# Patient Record
Sex: Male | Born: 1975 | Hispanic: No | Marital: Married | Smoking: Never smoker
Health system: Southern US, Community
[De-identification: ages and names within clinical notes are randomized; demographics above are authoritative.]

## PROBLEM LIST (undated history)

## (undated) DIAGNOSIS — I10 Essential (primary) hypertension: Secondary | ICD-10-CM

## (undated) DIAGNOSIS — L309 Dermatitis, unspecified: Secondary | ICD-10-CM

## (undated) DIAGNOSIS — E785 Hyperlipidemia, unspecified: Secondary | ICD-10-CM

## (undated) HISTORY — DX: Hyperlipidemia, unspecified: E78.5

## (undated) HISTORY — DX: Dermatitis, unspecified: L30.9

## (undated) HISTORY — PX: VASECTOMY: SHX75

## (undated) HISTORY — DX: Essential (primary) hypertension: I10

---

## 2010-10-29 ENCOUNTER — Ambulatory Visit
Admission: RE | Admit: 2010-10-29 | Discharge: 2010-10-29 | Payer: Self-pay | Source: Home / Self Care | Attending: Urology | Admitting: Urology

## 2010-11-02 LAB — POCT I-STAT 4, (NA,K, GLUC, HGB,HCT)
Glucose, Bld: 102 mg/dL — ABNORMAL HIGH (ref 70–99)
HCT: 46 % (ref 39.0–52.0)
Hemoglobin: 15.6 g/dL (ref 13.0–17.0)
Potassium: 4.3 mEq/L (ref 3.5–5.1)
Sodium: 140 mEq/L (ref 135–145)

## 2010-11-05 NOTE — Op Note (Addendum)
  Chris Shah, Chris Shah           ACCOUNT NO.:  [id number]  MEDICAL RECORD NO.:  [id number]          PATIENT TYPE:  AMB  LOCATION:  [facility]                         FACILITY:  [facility]  PHYSICIAN:  Galen Malkowski C. Vernie Ammons, M.D.  DATE OF BIRTH:  [birth year]  DATE OF PROCEDURE:  10/29/2010 DATE OF DISCHARGE:                              OPERATIVE REPORT   PREOPERATIVE DIAGNOSIS:  Redundant foreskin.  POSTOPERATIVE DIAGNOSIS:  Redundant foreskin.  PROCEDURE:  Circumcision.  SURGEON:  Ausencio Vaden C. Vernie Ammons, MD  ANESTHESIA:  General with local supplement.  SPECIMENS:  None.  BLOOD LOSS:  Minimal.  COMPLICATIONS:  None.  INDICATIONS:  The patient is a 35 year old male with a significantly redundant foreskin.  He has elected to proceed with circumcision and we had previously discussed the risks, complications and alternatives.  He understands and has elected to proceed.  DESCRIPTION OF OPERATION:  After informed consent, the patient was brought to the major OR, placed on the table and administered general anesthesia.  The genitalia was sterilely prepped and draped and then an official time-out was performed.  Half percent plain Marcaine was then used to perform a dorsal penile block in standard fashion.  I then marked the penis with a surgical marker where the shaft skin was noted to lie over the corona.  I then retracted the foreskin and made a circumcising incision circumferentially approximately 6 mm from the corona.  I then replaced the foreskin in its normal anatomic position and made a second circumcising incision circumferentially.  I then excised the redundant foreskin and cauterized any bleeding points.  The frenulum was reconstructed with interrupted 3-0 chromic suture and I then placed a 3-0 chromic U stitch at the 6 o'clock position and a second 3-0 chromic at the 12 o'clock position.  The skin edges were then reapproximated using running 3-0 chromic suture.  I applied  Neosporin, 4x4's and a suturing dressing.  The patient was awakened and taken to recovery room in stable and satisfactory condition.  He tolerated procedure well and there no intraoperative complications.  He will be given a prescription for 30 Tylox and follow up in my office in approximately 1 week.  Written discharge instructions were given as well as at the time of discharge.     Avo Schlachter C. Vernie Ammons, M.D.     MCO/MEDQ  D:  10/29/2010  T:  10/29/2010  Job:  [id number]  Electronically Signed by Ihor Gully M.D. on 11/05/2010 04:29:04 AM

## 2016-09-23 ENCOUNTER — Other Ambulatory Visit (HOSPITAL_COMMUNITY): Payer: Self-pay | Admitting: Internal Medicine

## 2016-09-23 ENCOUNTER — Ambulatory Visit (HOSPITAL_COMMUNITY)
Admission: RE | Admit: 2016-09-23 | Discharge: 2016-09-23 | Disposition: A | Discharge status: Home / Self Care | Payer: 59 | Source: Ambulatory Visit | Attending: Internal Medicine | Admitting: Internal Medicine

## 2016-09-23 DIAGNOSIS — R319 Hematuria, unspecified: Secondary | ICD-10-CM | POA: Insufficient documentation

## 2016-09-23 DIAGNOSIS — M549 Dorsalgia, unspecified: Secondary | ICD-10-CM | POA: Diagnosis not present

## 2017-02-12 IMAGING — CT CT RENAL STONE PROTOCOL
2 of 4 series · 17 of 46 positions shown, 19 images · non-contrast
Comparison: None.

CLINICAL DATA: Right flank pain for 3 weeks with hematuria

EXAM:
CT ABDOMEN AND PELVIS WITHOUT CONTRAST
TECHNIQUE: Multidetector CT imaging of the abdomen and pelvis was performed
following the standard protocol without IV contrast.

[Series 2: axial st · axial · 0.75mm/px · z∈[-476,-60]mm · 14 of 93 slices shown, 16 images]
[im 5/93  soft-tissue]
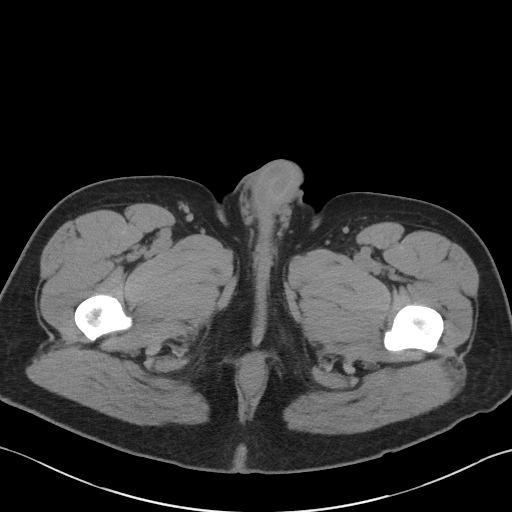
[im 5/93  bone]
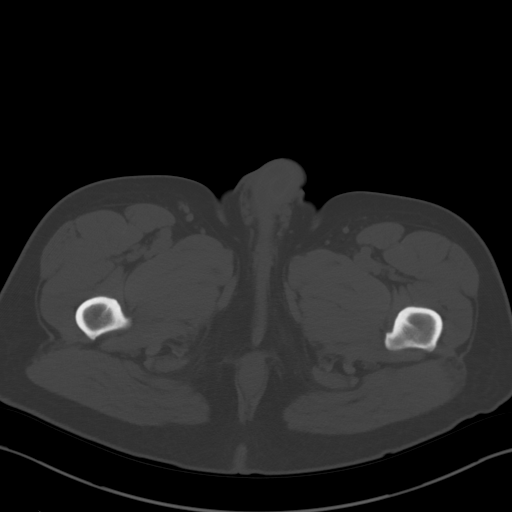
[im 14/93  soft-tissue]
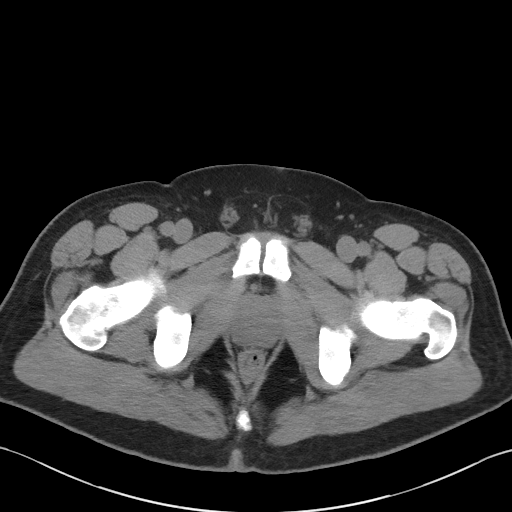
[im 18/93  soft-tissue]
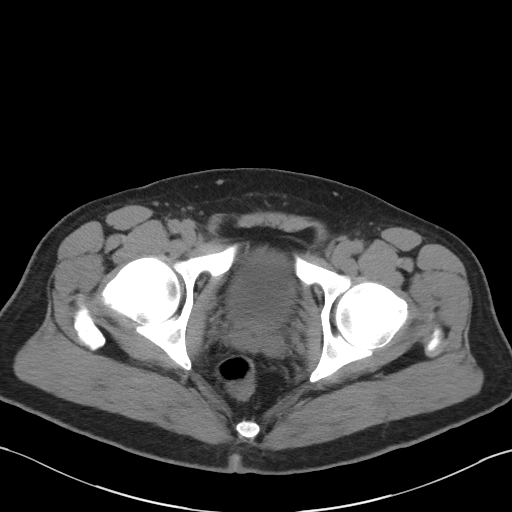
[im 27/93  soft-tissue]
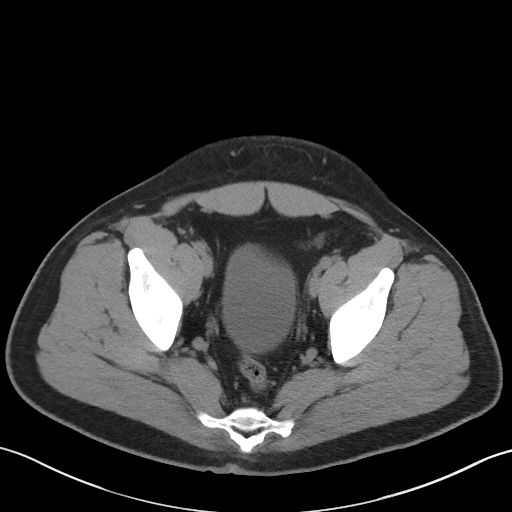
[im 31/93  soft-tissue]
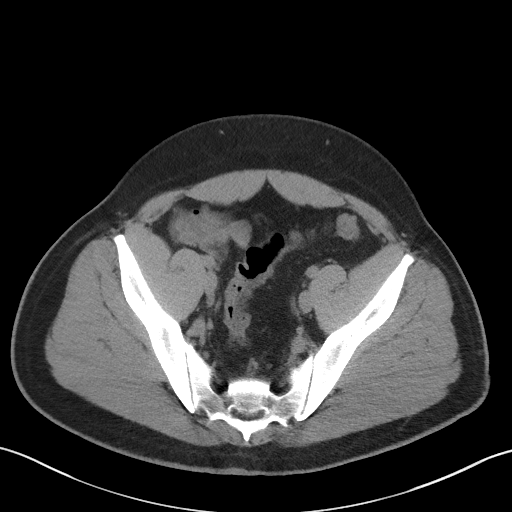
[im 36/93  soft-tissue]
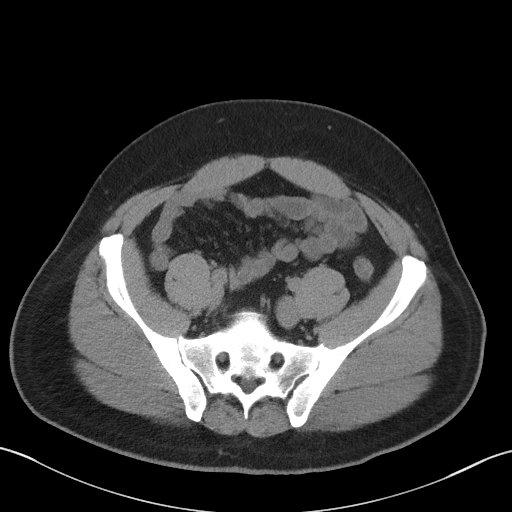
[im 44/93  soft-tissue]
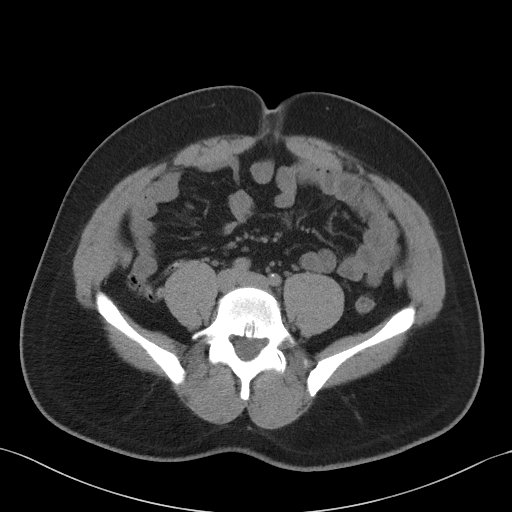
[im 49/93  soft-tissue]
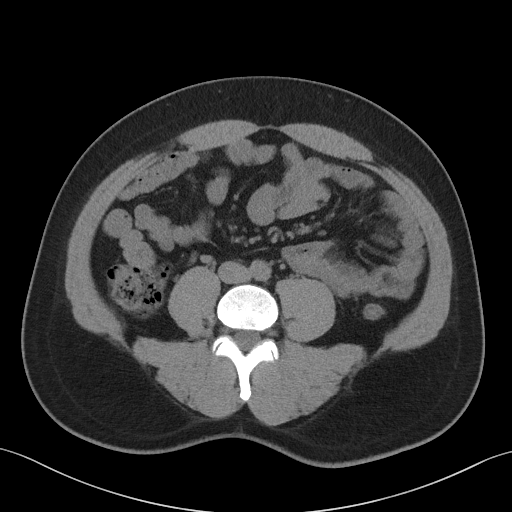
[im 57/93  soft-tissue]
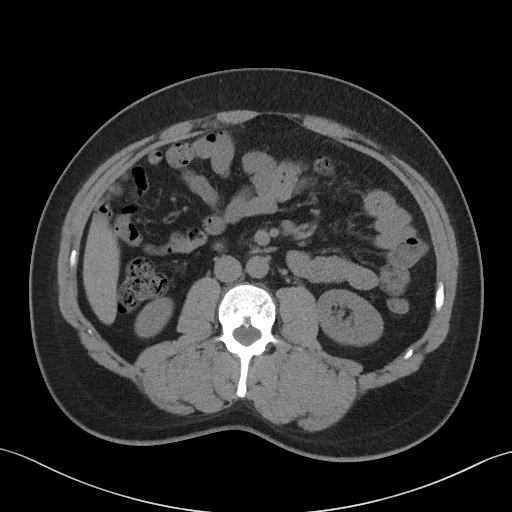
[im 57/93  bone]
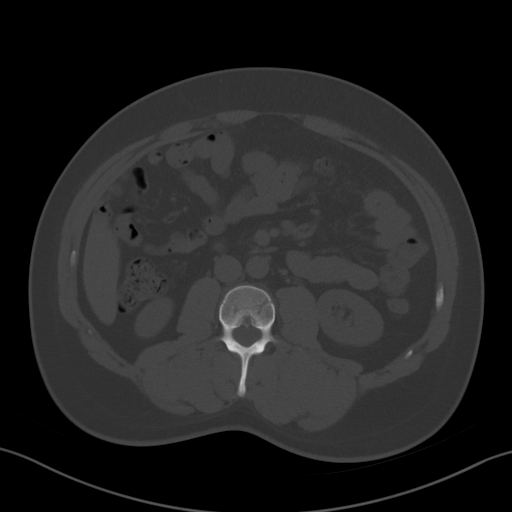
[im 62/93  soft-tissue]
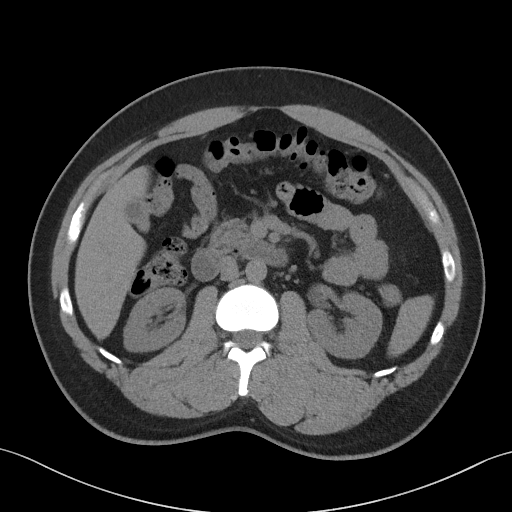
[im 71/93  soft-tissue]
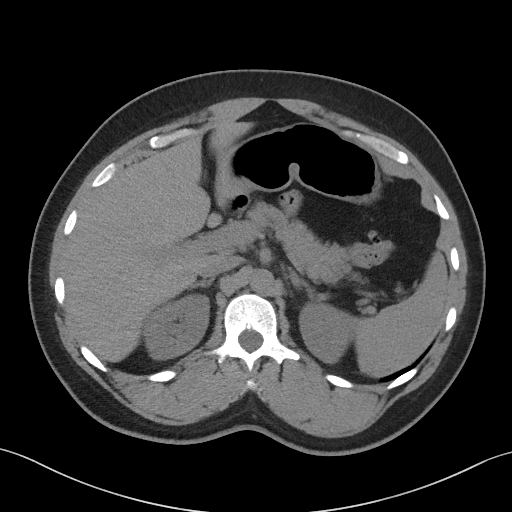
[im 75/93  soft-tissue]
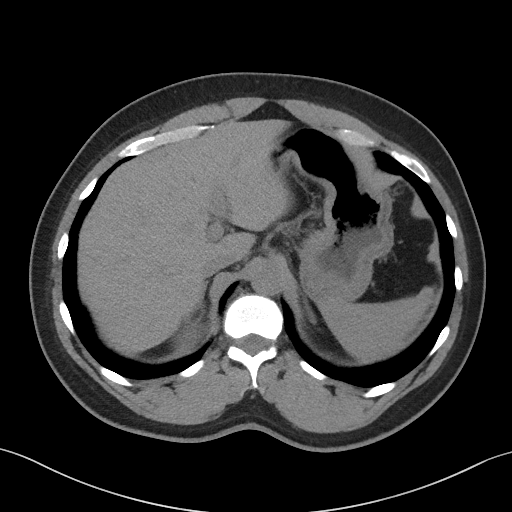
[im 79/93  soft-tissue]
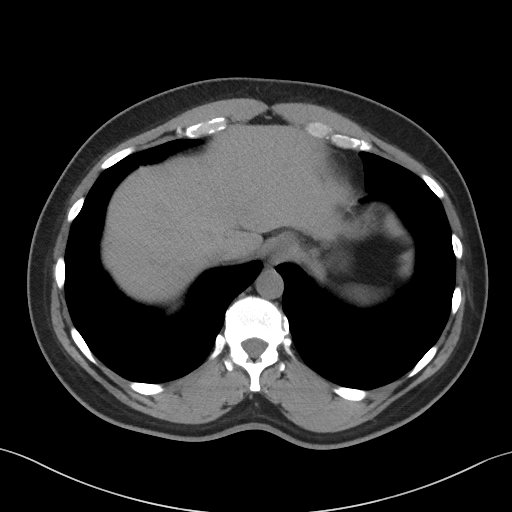
[im 88/93  soft-tissue]
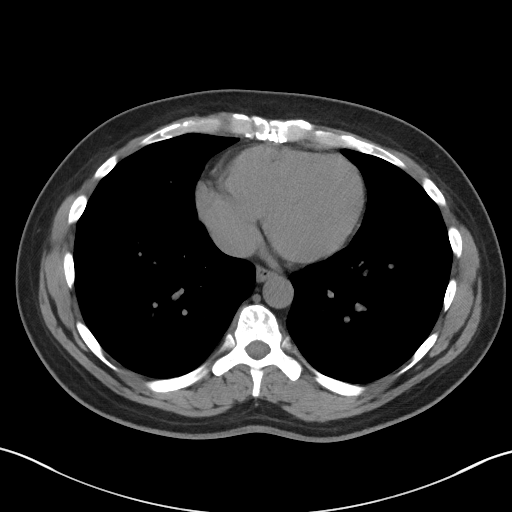

[Series 3: coronal st · coronal · 0.80mm/px · 3 of 101 slices shown]
[im 34/101  soft-tissue]
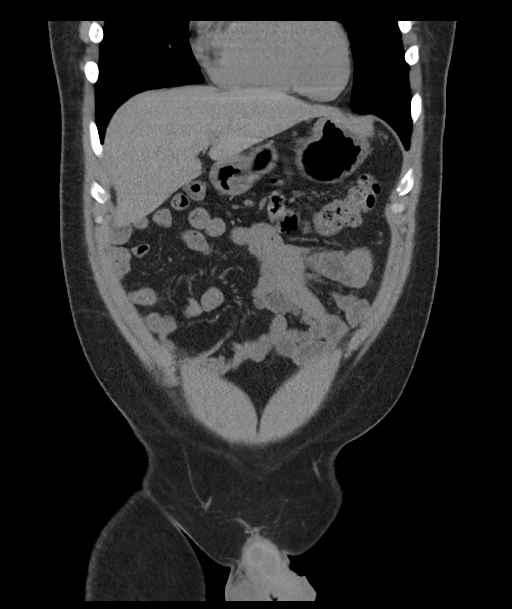
[im 45/101  soft-tissue]
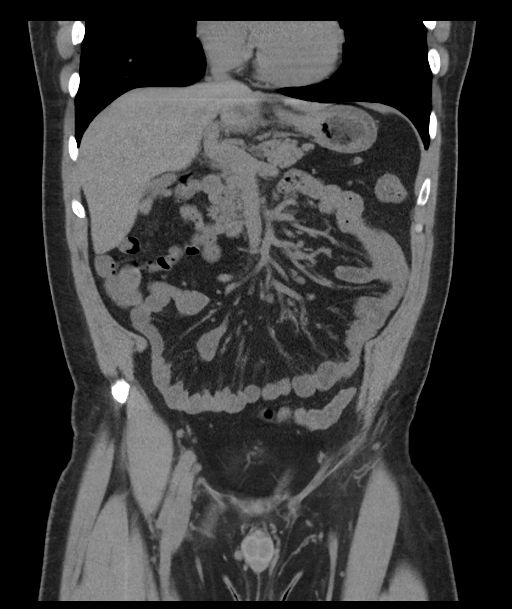
[im 56/101  soft-tissue]
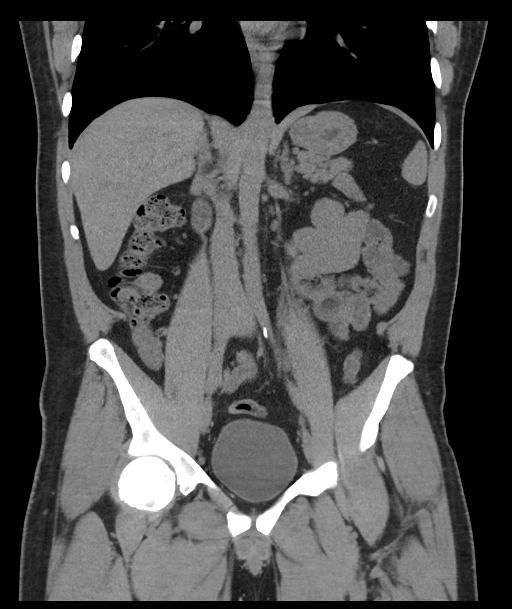

[17 of 46 positions shown; findings below may reference images not displayed]

FINDINGS: Lower chest: No acute abnormality.

Hepatobiliary: No focal liver abnormality is seen. No gallstones,
gallbladder wall thickening, or biliary dilatation.

Pancreas: Unremarkable. No pancreatic ductal dilatation or
surrounding inflammatory changes.

Spleen: Normal in size without focal abnormality.

Adrenals/Urinary Tract: Adrenal glands are unremarkable. Kidneys are
normal, without renal calculi, focal lesion, or hydronephrosis.
Bladder is unremarkable.

Stomach/Bowel: Stomach is within normal limits. Appendix appears
normal. No evidence of bowel wall thickening, distention, or
inflammatory changes.

Vascular/Lymphatic: Aortic atherosclerosis. No enlarged abdominal or
pelvic lymph nodes.

Reproductive: Prostate is unremarkable.

Other: No abdominal wall hernia or abnormality. No abdominopelvic
ascites.

Musculoskeletal: No acute or significant osseous findings.
IMPRESSION: No acute abnormality noted.

## 2017-11-23 ENCOUNTER — Encounter: Payer: Self-pay | Admitting: Family Medicine

## 2018-02-13 ENCOUNTER — Encounter: Payer: Self-pay | Admitting: Family Medicine

## 2018-02-13 ENCOUNTER — Other Ambulatory Visit: Payer: Self-pay

## 2018-02-13 ENCOUNTER — Ambulatory Visit (INDEPENDENT_AMBULATORY_CARE_PROVIDER_SITE_OTHER): Payer: BLUE CROSS/BLUE SHIELD | Admitting: Family Medicine

## 2018-02-13 VITALS — BP 142/88 | HR 82 | Temp 97.7°F | Resp 14 | Ht 71.26 in | Wt 216.0 lb

## 2018-02-13 DIAGNOSIS — I10 Essential (primary) hypertension: Secondary | ICD-10-CM

## 2018-02-13 DIAGNOSIS — M25552 Pain in left hip: Secondary | ICD-10-CM | POA: Diagnosis not present

## 2018-02-13 DIAGNOSIS — E782 Mixed hyperlipidemia: Secondary | ICD-10-CM

## 2018-02-13 DIAGNOSIS — E663 Overweight: Secondary | ICD-10-CM | POA: Diagnosis not present

## 2018-02-13 DIAGNOSIS — Z114 Encounter for screening for human immunodeficiency virus [HIV]: Secondary | ICD-10-CM | POA: Diagnosis not present

## 2018-02-13 DIAGNOSIS — E785 Hyperlipidemia, unspecified: Secondary | ICD-10-CM | POA: Insufficient documentation

## 2018-02-13 DIAGNOSIS — Z0000 Encounter for general adult medical examination without abnormal findings: Secondary | ICD-10-CM | POA: Diagnosis not present

## 2018-02-13 DIAGNOSIS — E669 Obesity, unspecified: Secondary | ICD-10-CM | POA: Insufficient documentation

## 2018-02-13 DIAGNOSIS — E66811 Obesity, class 1: Secondary | ICD-10-CM | POA: Insufficient documentation

## 2018-02-13 MED ORDER — AMLODIPINE-OLMESARTAN 10-40 MG PO TABS: 1.0000 | ORAL_TABLET | Freq: Every day | ORAL | 2 refills | Status: DC

## 2018-02-13 NOTE — Patient Instructions (Signed)
Get xray at [facility] Release of records- Dr. Catalina Pizza  F/U pending results

## 2018-02-13 NOTE — Progress Notes (Signed)
   Subjective:    Patient ID: Chris Shah, male    DOB: 10-13-1975, 42 y.o.   MRN: [id number]  Patient presents for New Patient CPE (is fasting)    Last PCP- Dr. Catalina Pizza - last visit in December   No specialist     DOT - drives locally , previously in sales    HTN- was on Azor , diagnosed 15 years ago   Hyperlipidemia - working on diet    Eats acidic foods/fruits- rash around face mostly berries/hot sauce    3 girls 18, 20,25     Follows with dentist- [employer]- thinks he had tetanus, does not get flu shot      Left Hip pain, feels like it gets a catch at times, no radiating pain, hip pain for 5-6 months. No OTC meds taken.        Review Of Systems:  GEN- denies fatigue, fever, weight loss,weakness, recent illness HEENT- denies eye drainage, change in vision, nasal discharge, CVS- denies chest pain, palpitations RESP- denies SOB, cough, wheeze ABD- denies N/V, change in stools, abd pain GU- denies dysuria, hematuria, dribbling, incontinence MSK- + joint pain, muscle aches, injury Neuro- denies headache, dizziness, syncope, seizure activity       Objective:    BP (!) 142/88   Pulse 82   Temp 97.7 F (36.5 C) (Oral)   Resp 14   Ht 5' 11.26" (1.81 m)   Wt 216 lb (98 kg)   SpO2 97%   BMI 29.91 kg/m  GEN- NAD, alert and oriented x3 HEENT- PERRL, EOMI, non injected sclera, pink conjunctiva, MMM, oropharynx clear Neck- Supple, no thyromegaly CVS- RRR, no murmur RESP-CTAB ABD-NABS,soft,NT,ND MSK- good ROM bilat hip, neck hip rock, pain with IR left hip, pain in hhip with SLR, no radiating symptoms, Spine NT, FROM Spine  Psych- normal affect and mood  EXT- No edema Pulses- Radial, DP- 2+        Assessment & Plan:      Problem List Items Addressed This Visit      Unprioritized   Hyperlipidemia   Relevant Medications   amLODipine-olmesartan (AZOR) 10-40 MG tablet   Other Relevant Orders   Lipid panel (Completed)   Overweight (BMI 25.0-29.9)    Discussed healthy eating, activity      Left hip pain    Obtain xrays       Relevant Orders   DG HIP UNILAT WITH PELVIS 2-3 VIEWS LEFT   Hypertension    Restart BP meds Fasting labs      Relevant Medications   amLODipine-olmesartan (AZOR) 10-40 MG tablet    Other Visit Diagnoses    Routine general medical examination at a health care facility    -  Primary   CPE done, fasting labs, HIV screen   Relevant Orders   CBC with Differential/Platelet (Completed)   Comprehensive metabolic panel (Completed)   Lipid panel (Completed)   Encounter for screening for HIV       Relevant Orders   HIV antibody (Completed)      Note: This dictation was prepared with Dragon dictation along with smaller phrase technology. Any transcriptional errors that result from this process are unintentional.

## 2018-02-14 LAB — LIPID PANEL
CHOLESTEROL: 237 mg/dL — AB (ref <200)
HDL: 50 mg/dL (ref >40)
LDL Cholesterol (Calc): 166 mg/dL (calc) — ABNORMAL HIGH
NON-HDL CHOLESTEROL (CALC): 187 mg/dL — AB (ref <130)
Total CHOL/HDL Ratio: 4.7 (calc) (ref <5.0)
Triglycerides: 99 mg/dL (ref <150)

## 2018-02-14 LAB — CBC WITH DIFFERENTIAL/PLATELET
BASOS PCT: 0.4 %
Basophils Absolute: 28 cells/uL (ref 0–200)
Eosinophils Absolute: 173 cells/uL (ref 15–500)
Eosinophils Relative: 2.5 %
HEMATOCRIT: 42.9 % (ref 38.5–50.0)
HEMOGLOBIN: 14.4 g/dL (ref 13.2–17.1)
Lymphs Abs: 2429 cells/uL (ref 850–3900)
MCH: 26.4 pg — AB (ref 27.0–33.0)
MCHC: 33.6 g/dL (ref 32.0–36.0)
MCV: 78.7 fL — AB (ref 80.0–100.0)
MPV: 9.3 fL (ref 7.5–12.5)
Monocytes Relative: 12.1 %
NEUTROS ABS: 3436 {cells}/uL (ref 1500–7800)
Neutrophils Relative %: 49.8 %
Platelets: 282 10*3/uL (ref 140–400)
RBC: 5.45 10*6/uL (ref 4.20–5.80)
RDW: 13.8 % (ref 11.0–15.0)
Total Lymphocyte: 35.2 %
WBC: 6.9 10*3/uL (ref 3.8–10.8)
WBCMIX: 835 {cells}/uL (ref 200–950)

## 2018-02-14 LAB — COMPREHENSIVE METABOLIC PANEL
AG RATIO: 1.5 (calc) (ref 1.0–2.5)
ALKALINE PHOSPHATASE (APISO): 59 U/L (ref 40–115)
ALT: 38 U/L (ref 9–46)
AST: 38 U/L (ref 10–40)
Albumin: 4.2 g/dL (ref 3.6–5.1)
BUN: 16 mg/dL (ref 7–25)
CO2: 28 mmol/L (ref 20–32)
Calcium: 9.7 mg/dL (ref 8.6–10.3)
Chloride: 103 mmol/L (ref 98–110)
Creat: 1 mg/dL (ref 0.60–1.35)
Globulin: 2.8 g/dL (calc) (ref 1.9–3.7)
Glucose, Bld: 96 mg/dL (ref 65–99)
Potassium: 4.2 mmol/L (ref 3.5–5.3)
Sodium: 138 mmol/L (ref 135–146)
Total Bilirubin: 1.3 mg/dL — ABNORMAL HIGH (ref 0.2–1.2)
Total Protein: 7 g/dL (ref 6.1–8.1)

## 2018-02-14 LAB — HIV ANTIBODY (ROUTINE TESTING W REFLEX): HIV 1&2 Ab, 4th Generation: NONREACTIVE

## 2018-02-14 NOTE — Assessment & Plan Note (Signed)
Obtain x-rays 

## 2018-02-14 NOTE — Assessment & Plan Note (Signed)
Restart BP meds Fasting labs

## 2018-02-14 NOTE — Assessment & Plan Note (Signed)
Discussed healthy eating, activity

## 2018-03-01 ENCOUNTER — Encounter: Payer: Self-pay | Admitting: Physician Assistant

## 2018-03-01 ENCOUNTER — Encounter: Payer: Self-pay | Admitting: Family Medicine

## 2018-03-01 ENCOUNTER — Ambulatory Visit (INDEPENDENT_AMBULATORY_CARE_PROVIDER_SITE_OTHER): Payer: BLUE CROSS/BLUE SHIELD | Admitting: Physician Assistant

## 2018-03-01 VITALS — BP 142/102 | HR 79 | Temp 97.6°F | Resp 16 | Ht 71.26 in | Wt 212.8 lb

## 2018-03-01 DIAGNOSIS — M5432 Sciatica, left side: Secondary | ICD-10-CM

## 2018-03-01 MED ORDER — METHYLPREDNISOLONE ACETATE 80 MG/ML IJ SUSP
80.0000 mg | Freq: Once | INTRAMUSCULAR | Status: AC
  Administered 2018-03-01: 80 mg via INTRAMUSCULAR

## 2018-03-01 MED ORDER — PREDNISONE 20 MG PO TABS: ORAL_TABLET | ORAL | 0 refills | Status: DC

## 2018-03-01 MED ORDER — CYCLOBENZAPRINE HCL 10 MG PO TABS: 10.0000 mg | ORAL_TABLET | Freq: Three times a day (TID) | ORAL | 0 refills | Status: DC | PRN

## 2018-03-01 NOTE — Progress Notes (Signed)
Patient ID: Chris Shah MRN: [id number], DOB: 17-Jun-1976, 42 y.o. Date of Encounter: 03/01/2018, 11:27 AM    Chief Complaint:  Chief Complaint  Patient presents with  . left hip pain    jumped out of chair      HPI: 42 y.o. year old male presents with above.   States that this past Sunday is when this occurred.  Says that they were sitting in their driveway and his granddaughter was pushing a stroller.   He jumped up out of the chair to go prevent the stroller from hitting into the car.  When he did that, it caused significant pain in his left sciatic notch region.  Since then, he has continued to have pain in that left sciatic notch region that is going down his left buttock and left thigh.  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  Says that that area already was feeling a little irritated secondary to the repetitive motion of that clutch.  However since he did this movement this past Sunday it has really been painful and feels tight.  Has not been able to go to work this week.  Usually works Monday through Friday.  Last worked last Friday.  Has been out all this week so far.  States that -- other than this area feeling a little irritated recently secondary to repetitively pressing the clutch at work truck, otherwise has never had problems with this area of his body in the past.   Has no known prior history of these kinds of symptoms--- has had no known trauma or injury to the area.     Home Meds:   Outpatient Medications Prior to Visit  Medication Sig Dispense Refill  . amLODipine-olmesartan (AZOR) 10-40 MG tablet Take 1 tablet by mouth daily. 90 tablet 2   No facility-administered medications prior to visit.     Allergies: No Known Allergies    Review of Systems: See HPI for pertinent ROS. All other ROS negative.    Physical Exam: Blood pressure (!) 142/102, pulse 79, temperature 97.6 F (36.4 C), temperature source Oral, resp. rate 16, height 5'  11.26" (1.81 m), weight 96.5 kg (212 lb 12.8 oz), SpO2 99 %., Body mass index is 29.46 kg/m. General: WNWD AAM.  Appears in no acute distress. Neck: Supple. No thyromegaly. No lymphadenopathy. Lungs: Clear bilaterally to auscultation without wheezes, rales, or rhonchi. Breathing is unlabored. Heart: Regular rhythm. No murmurs, rubs, or gallops. Msk:  Strength and tone normal for age. He has severe tenderness with palpation of left sciatic notch.   No significant tenderness with palpation of left low back.   When I enter Into the room he is sitting in a chair but is sitting in an awkward position trying to relieve pressure from the sciatic notch region.  When he gets up from the chair to do the exam he very slowly gets up out of the chair and has to move with very slow movements.  Straight leg raise positive.  Abduction of the hip is also positive and causes increased pain.  Patellar reflexes are intact and equal bilaterally. Extremities/Skin: Warm and dry.  Neuro: Alert and oriented X 3. Moves all extremities spontaneously. Gait is normal. CNII-XII grossly in tact. Psych:  Responds to questions appropriately with a normal affect.     ASSESSMENT AND PLAN:  42 y.o. year old male with  1. Left sided sciatica Depo-Medrol 80 mg IM given here in the office. He  is to start the oral prednisone taper tomorrow and take in the mornings.  I have reviewed the dosing and taper with him as well as possible side effects. I have also discussed with him that the Flexeril may cause drowsiness.  If it does not cause drowsiness then can take up to 3 times daily but if causes drowsiness then may need to limit to bedtime only.   Definitely do not use prior to driving or operating machinery. Note given to cover for missing work all of this week. (oow 02/26/18 -- 03/02/2018) Follow-up if symptoms do not resolve over the next several days. Also discussed that he can apply heat to the area using a heating pad and also  discussed gently stretching the area once symptoms are improved to prevent reoccurrence. - predniSONE (DELTASONE) 20 MG tablet; Take 3 daily for 2 days, then 2 daily for 2 days, then 1 daily for 2 days.  Dispense: 12 tablet; Refill: 0 - cyclobenzaprine (FLEXERIL) 10 MG tablet; Take 1 tablet (10 mg total) by mouth 3 (three) times daily as needed for muscle spasms.  Dispense: 30 tablet; Refill: 0   Signed, [address] [place], [place], [facility] 03/01/2018 11:27 AM

## 2018-03-01 NOTE — Progress Notes (Signed)
Patient was in office and received depo medrol 80 mg in left ventrogluteal. Patient tolerated well

## 2018-03-28 ENCOUNTER — Other Ambulatory Visit (HOSPITAL_COMMUNITY)
Admission: RE | Admit: 2018-03-28 | Discharge: 2018-03-28 | Disposition: A | Discharge status: Home / Self Care | Payer: BLUE CROSS/BLUE SHIELD | Source: Ambulatory Visit | Attending: Gastroenterology | Admitting: Gastroenterology

## 2018-03-28 ENCOUNTER — Telehealth: Payer: Self-pay

## 2018-03-28 ENCOUNTER — Ambulatory Visit (HOSPITAL_COMMUNITY)
Admission: RE | Admit: 2018-03-28 | Discharge: 2018-03-28 | Disposition: A | Discharge status: Home / Self Care | Payer: BLUE CROSS/BLUE SHIELD | Source: Ambulatory Visit | Attending: Gastroenterology | Admitting: Gastroenterology

## 2018-03-28 ENCOUNTER — Telehealth: Payer: Self-pay | Admitting: General Practice

## 2018-03-28 ENCOUNTER — Ambulatory Visit (HOSPITAL_COMMUNITY): Payer: BLUE CROSS/BLUE SHIELD

## 2018-03-28 ENCOUNTER — Encounter (HOSPITAL_COMMUNITY): Payer: Self-pay

## 2018-03-28 DIAGNOSIS — R1032 Left lower quadrant pain: Secondary | ICD-10-CM | POA: Insufficient documentation

## 2018-03-28 LAB — CBC WITH DIFFERENTIAL/PLATELET
BASOS ABS: 0 10*3/uL (ref 0.0–0.1)
Basophils Relative: 0 %
Eosinophils Absolute: 0.1 10*3/uL (ref 0.0–0.7)
Eosinophils Relative: 2 %
HCT: 44.5 % (ref 39.0–52.0)
HEMOGLOBIN: 14.6 g/dL (ref 13.0–17.0)
LYMPHS ABS: 3.1 10*3/uL (ref 0.7–4.0)
LYMPHS PCT: 40 %
MCH: 27.3 pg (ref 26.0–34.0)
MCHC: 32.8 g/dL (ref 30.0–36.0)
MCV: 83.3 fL (ref 78.0–100.0)
Monocytes Absolute: 0.6 10*3/uL (ref 0.1–1.0)
Monocytes Relative: 7 %
NEUTROS PCT: 51 %
Neutro Abs: 4.1 10*3/uL (ref 1.7–7.7)
Platelets: 257 10*3/uL (ref 150–400)
RBC: 5.34 MIL/uL (ref 4.22–5.81)
RDW: 13.4 % (ref 11.5–15.5)
WBC: 7.9 10*3/uL (ref 4.0–10.5)

## 2018-03-28 LAB — URINALYSIS, COMPLETE (UACMP) WITH MICROSCOPIC
BILIRUBIN URINE: NEGATIVE
Bacteria, UA: NONE SEEN
Glucose, UA: NEGATIVE mg/dL
Hgb urine dipstick: NEGATIVE
Ketones, ur: NEGATIVE mg/dL
LEUKOCYTES UA: NEGATIVE
Nitrite: NEGATIVE
PROTEIN: NEGATIVE mg/dL
SPECIFIC GRAVITY, URINE: 1.039 — AB (ref 1.005–1.030)
pH: 7 (ref 5.0–8.0)

## 2018-03-28 MED ORDER — IOPAMIDOL (ISOVUE-300) INJECTION 61%
100.0000 mL | Freq: Once | INTRAVENOUS | Status: AC | PRN
  Administered 2018-03-28: 100 mL via INTRAVENOUS

## 2018-03-28 NOTE — Telephone Encounter (Signed)
T/C from [place],  The report is negative and per Dr. Darrick PennaFields OK for pt to go.

## 2018-03-28 NOTE — Telephone Encounter (Signed)
Patient will be at the lab at 12:15pm and then will go to Radiology for Stat ct scan to start drinking contrast at 12:30 for a 2:30 scan.

## 2018-03-28 NOTE — Telephone Encounter (Signed)
PT HAS ACUTE SEVERE LOWER ABDOMINAL PAIN. STARTED YESTERDAY. OPTIONS ARE GO TO ED OR HAVE STAT LABS/CT.

## 2018-03-28 NOTE — Telephone Encounter (Signed)
PT is aware.

## 2018-03-28 NOTE — Telephone Encounter (Signed)
Labs faxed [facility] to [facility]

## 2018-03-28 NOTE — Telephone Encounter (Signed)
Patient has not had anything to eat or drink since 8:30 am today.

## 2018-03-28 NOTE — Telephone Encounter (Signed)
Patient called in with lower abd pain.  He was moving something heavy yesterday and started having some sharp lower abd pain.  Patient is agreeable to imaging.  Routing to Dr. Darrick PennaFields to order imaging.

## 2018-03-28 NOTE — Telephone Encounter (Addendum)
PLEASE CALL PT. HIS CT DOES NOT SHOW ANY ACUTE PATHOLOGY AND HIS URINE IS NEGATIVE. HIS PAIN MAY BE DUE TO AN ABDOMINAL WALL STRAIN.  TAKE IBUPROFEN 600 MG Q6H FOR 3 DAYS THEN 1 PO TID FOR 7 DAYS. TAKE WITH FOOD OR MILK.  ICE PACK THREE TIMES A DAY HELP WITH PAIN. DO NOT APPLY ICE PACKS DIRECTLY TO YOUR SKIN.  PLEASE  CALL IN 7 DAYS IF SYMPTOMS ARE NOT IMPROVED.

## 2018-03-28 NOTE — Telephone Encounter (Signed)
I called [employer] and spoke with Jola BabinskiMarilyn I. And she stated that precertificaion is not required Ref# [id number]

## 2018-03-29 LAB — URINE CULTURE: Culture: NO GROWTH

## 2018-05-22 ENCOUNTER — Ambulatory Visit: Payer: Self-pay | Admitting: Family Medicine

## 2018-06-15 ENCOUNTER — Ambulatory Visit: Payer: Self-pay | Admitting: Family Medicine

## 2018-08-17 IMAGING — CT CT ABD-PELV W/ CM
2 of 4 series · 17 of 46 positions shown, 19 images · IV contrast (Isovue)
Comparison: 09/23/2016

CLINICAL DATA: Left lower quadrant pain for 2 days.

EXAM:
CT ABDOMEN AND PELVIS WITH CONTRAST
TECHNIQUE: Multidetector CT imaging of the abdomen and pelvis was performed
using the standard protocol following bolus administration of
intravenous contrast.
CONTRAST:  100mL [id number] IOPAMIDOL (TF480I-W55) INJECTION 61%

[Series 2: axial st · axial · 0.85mm/px · z∈[-401,+19]mm · 14 of 92 slices shown, 16 images]
[im 4/92  soft-tissue]
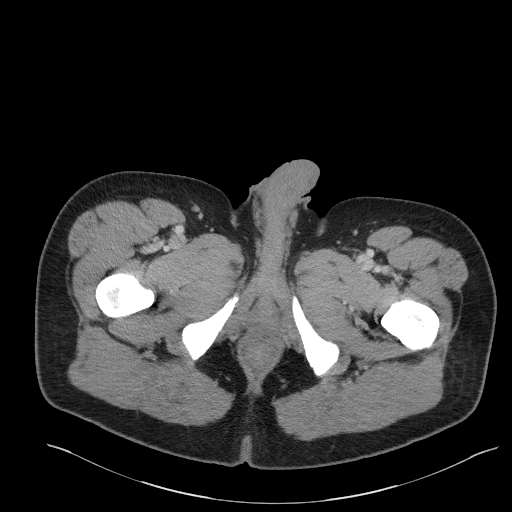
[im 4/92  bone]
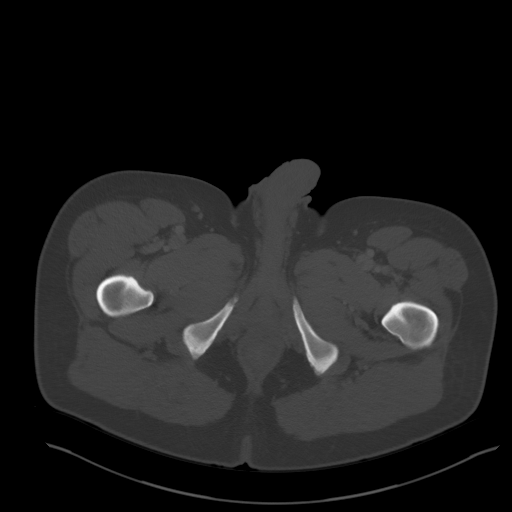
[im 11/92  soft-tissue]
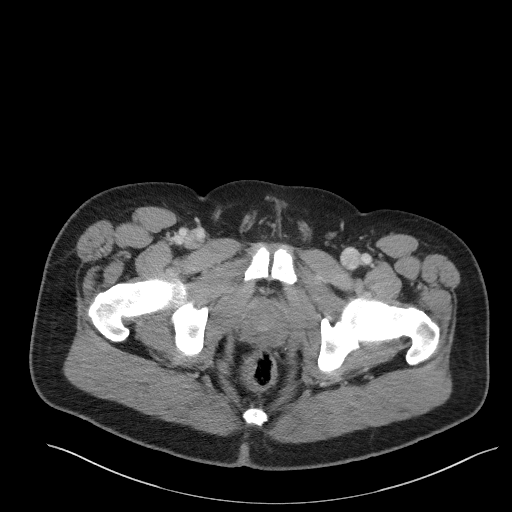
[im 19/92  soft-tissue]
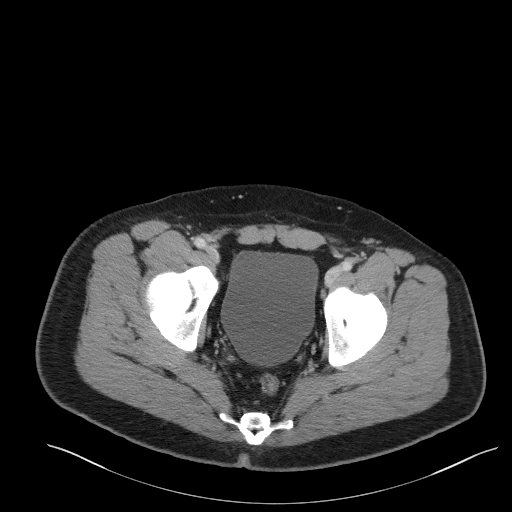
[im 26/92  soft-tissue]
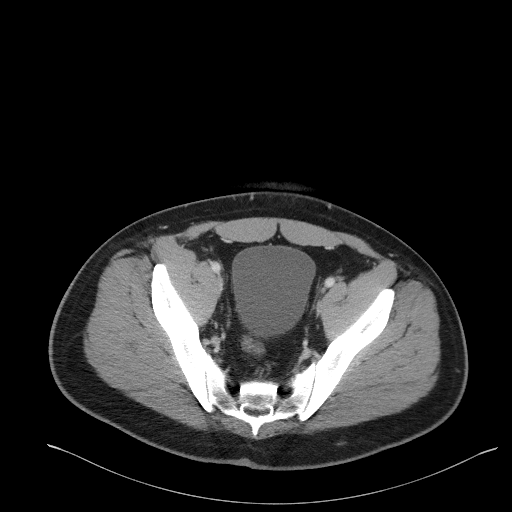
[im 30/92  soft-tissue]
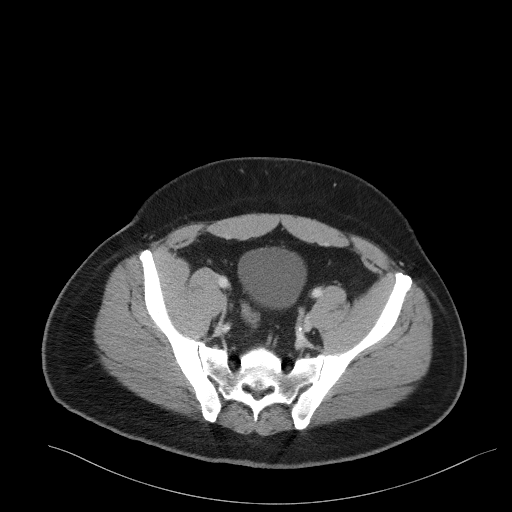
[im 37/92  soft-tissue]
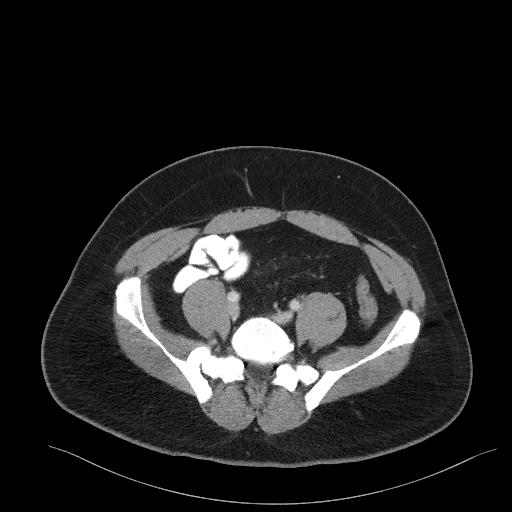
[im 44/92  soft-tissue]
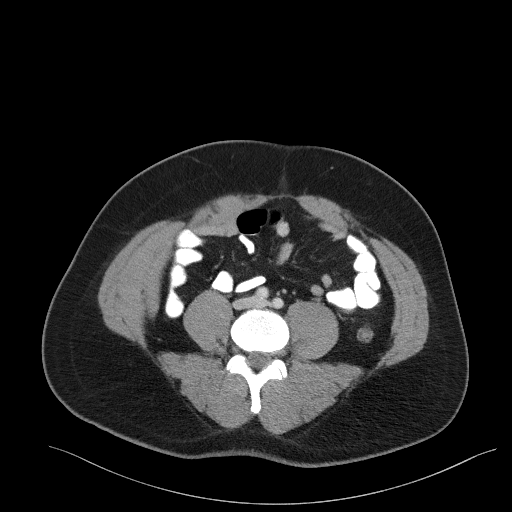
[im 48/92  soft-tissue]
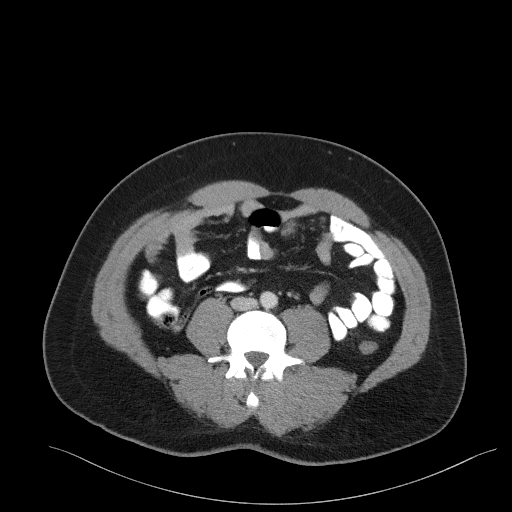
[im 55/92  soft-tissue]
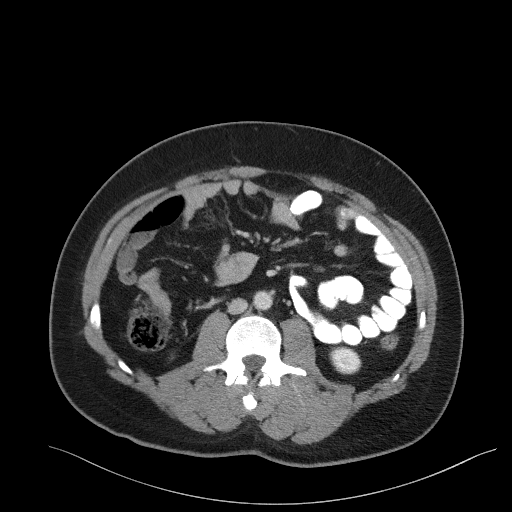
[im 55/92  bone]
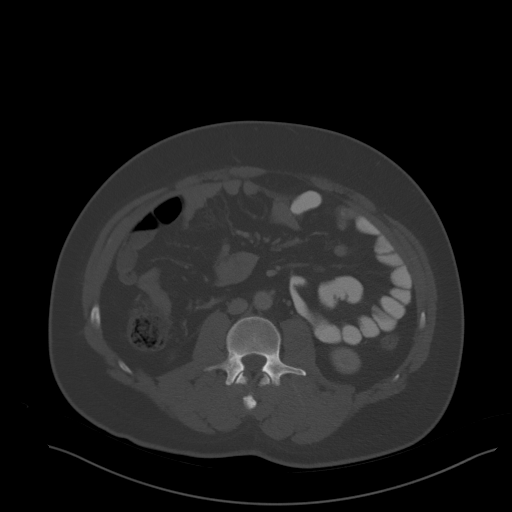
[im 62/92  soft-tissue]
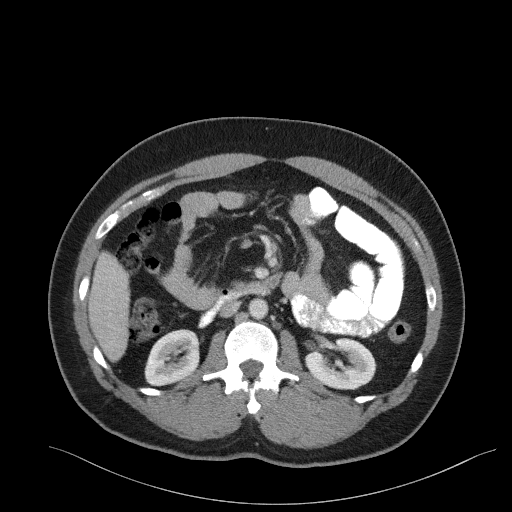
[im 70/92  soft-tissue]
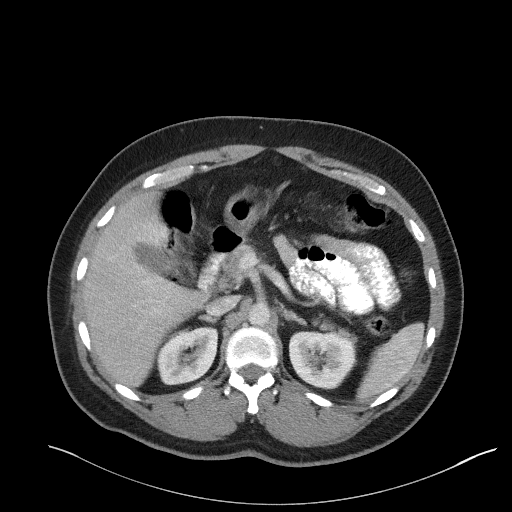
[im 73/92  soft-tissue]
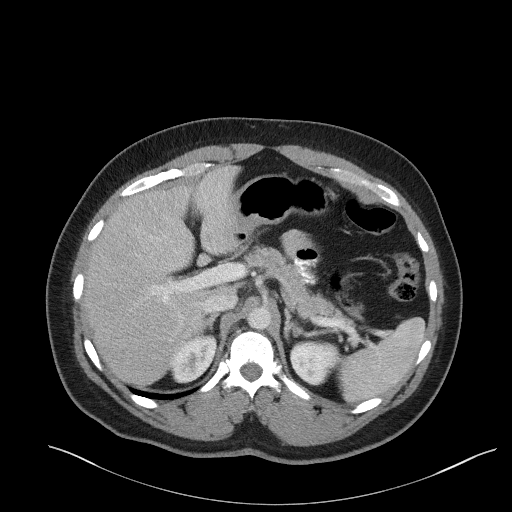
[im 81/92  soft-tissue]
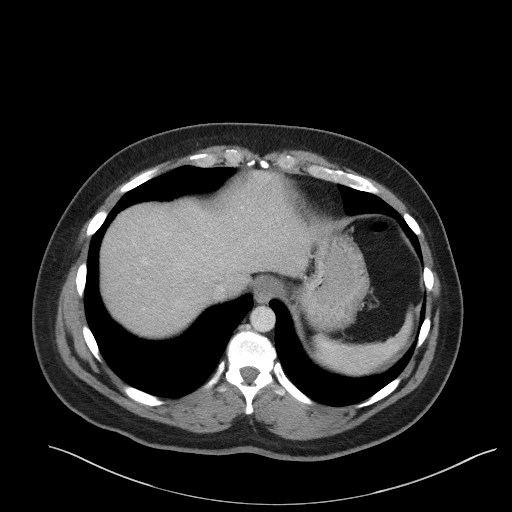
[im 88/92  soft-tissue]
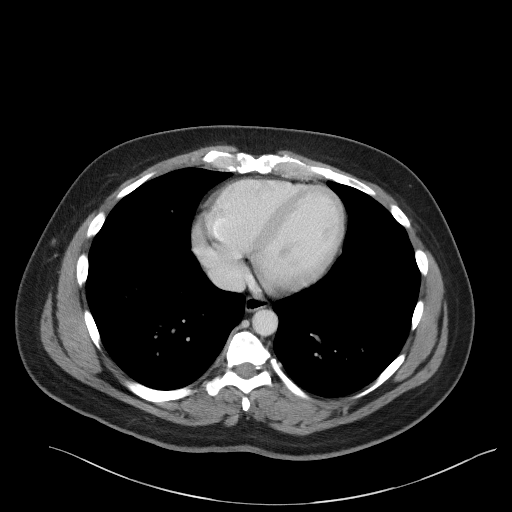

[Series 5: coronal st · coronal · 0.72mm/px · 3 of 101 slices shown]
[im 34/101  soft-tissue]
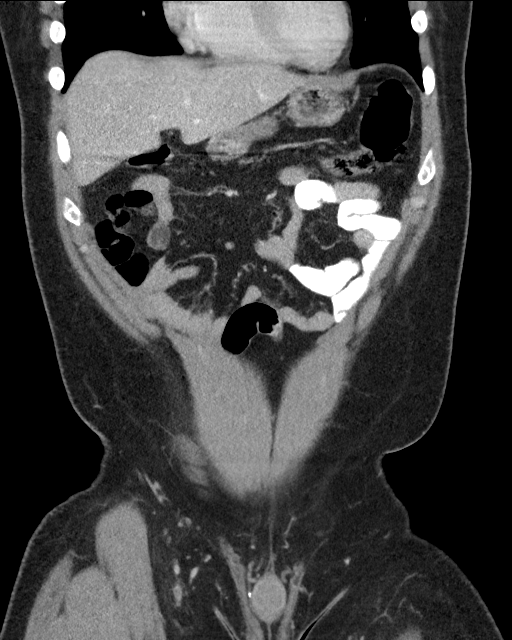
[im 45/101  soft-tissue]
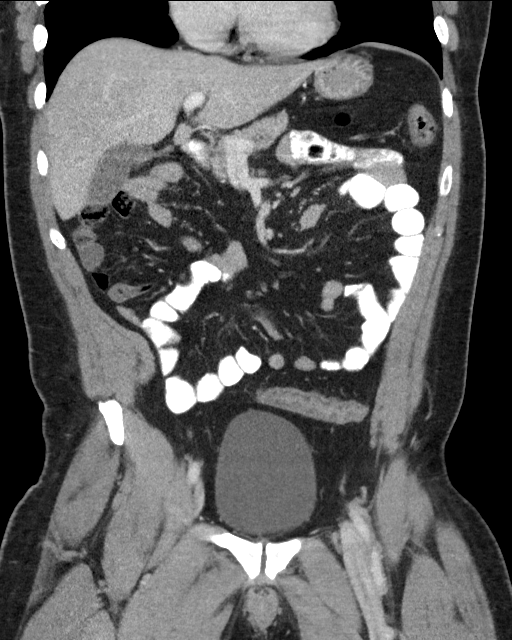
[im 56/101  soft-tissue]
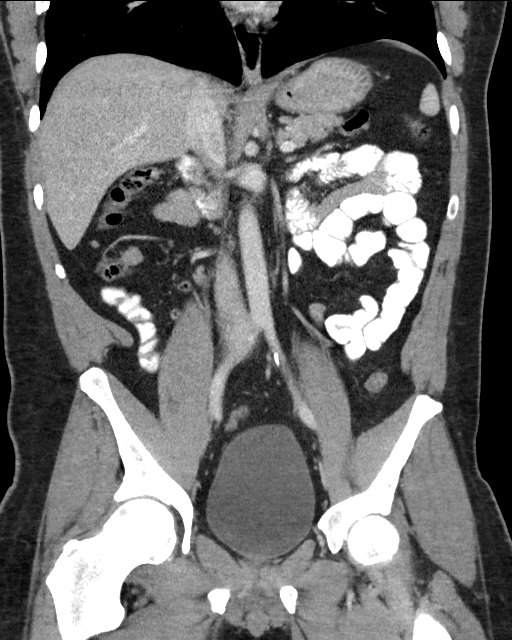

[17 of 46 positions shown; findings below may reference images not displayed]

FINDINGS: Lower Chest: No acute findings.

Hepatobiliary: No hepatic masses identified. Gallbladder is
unremarkable.

Pancreas:  No mass or inflammatory changes.

Spleen: Within normal limits in size and appearance.

Adrenals/Urinary Tract: No masses identified. No evidence of
hydronephrosis. Unremarkable unopacified urinary bladder.

Stomach/Bowel: No evidence of obstruction, inflammatory process or
abnormal fluid collections. Normal appendix visualized.

Vascular/Lymphatic: No pathologically enlarged lymph nodes. No
abdominal aortic aneurysm.

Reproductive:  No mass or other significant abnormality.

Other:  None.

Musculoskeletal:  No suspicious bone lesions identified.
IMPRESSION: Negative.  No acute findings or other significant abnormality.

## 2019-03-02 ENCOUNTER — Other Ambulatory Visit: Payer: Self-pay | Admitting: Family Medicine

## 2019-03-12 ENCOUNTER — Encounter: Payer: Self-pay | Admitting: Family Medicine

## 2019-03-12 ENCOUNTER — Ambulatory Visit (INDEPENDENT_AMBULATORY_CARE_PROVIDER_SITE_OTHER): Payer: BC Managed Care – PPO | Admitting: Family Medicine

## 2019-03-12 ENCOUNTER — Other Ambulatory Visit: Payer: Self-pay

## 2019-03-12 VITALS — BP 150/102 | HR 81 | Temp 97.6°F | Resp 16 | Ht 70.0 in | Wt 214.0 lb

## 2019-03-12 DIAGNOSIS — I10 Essential (primary) hypertension: Secondary | ICD-10-CM | POA: Diagnosis not present

## 2019-03-12 DIAGNOSIS — E6609 Other obesity due to excess calories: Secondary | ICD-10-CM | POA: Diagnosis not present

## 2019-03-12 DIAGNOSIS — M545 Low back pain, unspecified: Secondary | ICD-10-CM

## 2019-03-12 DIAGNOSIS — Z6830 Body mass index (BMI) 30.0-30.9, adult: Secondary | ICD-10-CM

## 2019-03-12 DIAGNOSIS — E782 Mixed hyperlipidemia: Secondary | ICD-10-CM

## 2019-03-12 MED ORDER — AMLODIPINE-OLMESARTAN 10-40 MG PO TABS: 1.0000 | ORAL_TABLET | Freq: Every day | ORAL | 2 refills | Status: DC

## 2019-03-12 NOTE — Patient Instructions (Signed)
F/U Physical 3 months

## 2019-03-12 NOTE — Assessment & Plan Note (Signed)
Uncontrolled, out of meds Restart azor Check BP at home Fasting labs today

## 2019-03-12 NOTE — Assessment & Plan Note (Signed)
Recent dietary change switch Goal lose 20-30lbs

## 2019-03-12 NOTE — Progress Notes (Signed)
   Subjective:    Patient ID: Chris Shah, male    DOB: [birth year], 43 y.o.   MRN: [id number]  Patient presents for Medication Refill (amlodipine)  Has been off Azor for the past 3 weeks, when he checks at home 117-120/70's. No chest pain, SOB, no difficulty with job    Hurt his back last weekend helping daughter move furniture, has tightness in lower back , used flexeril a few doses which has helped, no radiating symptoms, no change in bowel or bladder   Pescatrian - now   Review Of Systems:  GEN- denies fatigue, fever, weight loss,weakness, recent illness HEENT- denies eye drainage, change in vision, nasal discharge, CVS- denies chest pain, palpitations RESP- denies SOB, cough, wheeze ABD- denies N/V, change in stools, abd pain GU- denies dysuria, hematuria, dribbling, incontinence MSK- + joint pain, denies muscle aches, injury Neuro- denies headache, dizziness, syncope, seizure activity       Objective:    BP (!) 150/102   Pulse 81   Temp 97.6 F (36.4 C)   Resp 16   Ht 5\' 10"  (1.778 m)   Wt 214 lb (97.1 kg)   SpO2 98%   BMI 30.71 kg/m  GEN- NAD, alert and oriented x3 HEENT- PERRL, EOMI, non injected sclera, pink conjunctiva, MMM, oropharynx clear Neck- Supple, no thyromegaly CVS- RRR, no murmur RESP-CTAB ABD-NABS,soft,NT,ND MSK- Mild TTP lower paraspinals, spine NT, good ROM, neg SLR  normal gait, strength equal bilat LE EXT- No edema Pulses- Radial, DP- 2+        Assessment & Plan:      Problem List Items Addressed This Visit      Unprioritized   Hyperlipidemia - Primary   Relevant Medications   amLODipine-olmesartan (AZOR) 10-40 MG tablet   Other Relevant Orders   CBC with Differential/Platelet   Comprehensive metabolic panel   Lipid panel   Hypertension    Uncontrolled, out of meds Restart azor Check BP at home Fasting labs today       Relevant Medications   amLODipine-olmesartan (AZOR) 10-40 MG tablet   Other Relevant Orders   CBC with Differential/Platelet   Comprehensive metabolic panel   Obesity    Recent dietary change switch Goal lose 20-30lbs       Other Visit Diagnoses    Acute bilateral low back pain without sciatica       No red flags, MSK pain, discussed Yoga/stretching , prn flexeril, NSAID      Note: This dictation was prepared with Dragon dictation along with smaller phrase technology. Any transcriptional errors that result from this process are unintentional.

## 2019-03-13 LAB — CBC WITH DIFFERENTIAL/PLATELET
Absolute Monocytes: 713 cells/uL (ref 200–950)
Basophils Absolute: 43 cells/uL (ref 0–200)
Basophils Relative: 0.6 %
Eosinophils Absolute: 130 cells/uL (ref 15–500)
Eosinophils Relative: 1.8 %
HCT: 44.2 % (ref 38.5–50.0)
Hemoglobin: 14.5 g/dL (ref 13.2–17.1)
Lymphs Abs: 2578 cells/uL (ref 850–3900)
MCH: 26.6 pg — ABNORMAL LOW (ref 27.0–33.0)
MCHC: 32.8 g/dL (ref 32.0–36.0)
MCV: 81.1 fL (ref 80.0–100.0)
MPV: 9.4 fL (ref 7.5–12.5)
Monocytes Relative: 9.9 %
Neutro Abs: 3737 cells/uL (ref 1500–7800)
Neutrophils Relative %: 51.9 %
Platelets: 285 10*3/uL (ref 140–400)
RBC: 5.45 10*6/uL (ref 4.20–5.80)
RDW: 13.7 % (ref 11.0–15.0)
Total Lymphocyte: 35.8 %
WBC: 7.2 10*3/uL (ref 3.8–10.8)

## 2019-03-13 LAB — COMPREHENSIVE METABOLIC PANEL
AG Ratio: 1.6 (calc) (ref 1.0–2.5)
ALT: 35 U/L (ref 9–46)
AST: 31 U/L (ref 10–40)
Albumin: 4.1 g/dL (ref 3.6–5.1)
Alkaline phosphatase (APISO): 61 U/L (ref 36–130)
BUN: 10 mg/dL (ref 7–25)
CO2: 27 mmol/L (ref 20–32)
Calcium: 9.7 mg/dL (ref 8.6–10.3)
Chloride: 102 mmol/L (ref 98–110)
Creat: 0.91 mg/dL (ref 0.60–1.35)
Globulin: 2.5 g/dL (calc) (ref 1.9–3.7)
Glucose, Bld: 78 mg/dL (ref 65–99)
Potassium: 4.5 mmol/L (ref 3.5–5.3)
Sodium: 137 mmol/L (ref 135–146)
Total Bilirubin: 0.9 mg/dL (ref 0.2–1.2)
Total Protein: 6.6 g/dL (ref 6.1–8.1)

## 2019-03-13 LAB — LIPID PANEL
Cholesterol: 215 mg/dL — ABNORMAL HIGH (ref <200)
HDL: 51 mg/dL (ref >=40)
LDL Cholesterol (Calc): 144 mg/dL (calc) — ABNORMAL HIGH
Non-HDL Cholesterol (Calc): 164 mg/dL (calc) — ABNORMAL HIGH (ref <130)
Total CHOL/HDL Ratio: 4.2 (calc) (ref <5.0)
Triglycerides: 94 mg/dL (ref <150)

## 2019-06-28 ENCOUNTER — Encounter: Payer: Self-pay | Admitting: Family Medicine

## 2019-06-28 ENCOUNTER — Ambulatory Visit (INDEPENDENT_AMBULATORY_CARE_PROVIDER_SITE_OTHER): Payer: BC Managed Care – PPO | Admitting: Family Medicine

## 2019-06-28 ENCOUNTER — Other Ambulatory Visit: Payer: Self-pay

## 2019-06-28 VITALS — BP 140/90 | HR 72 | Temp 98.1°F | Resp 14 | Ht 70.0 in | Wt 203.0 lb

## 2019-06-28 DIAGNOSIS — E782 Mixed hyperlipidemia: Secondary | ICD-10-CM | POA: Diagnosis not present

## 2019-06-28 DIAGNOSIS — I10 Essential (primary) hypertension: Secondary | ICD-10-CM

## 2019-06-28 DIAGNOSIS — Z0001 Encounter for general adult medical examination with abnormal findings: Secondary | ICD-10-CM | POA: Diagnosis not present

## 2019-06-28 DIAGNOSIS — Z0000 Encounter for general adult medical examination without abnormal findings: Secondary | ICD-10-CM

## 2019-06-28 NOTE — Assessment & Plan Note (Signed)
Blood pressure is improved from his last visit.  His home readings are much better.  At this time as he is working on significant dietary changes trying to lose weight I am not going to add any additional medication have him continue to monitor at home.  He can call us if he does get reconsiders staying above 140/90.  Fasting labs to be obtained today.  He declines immunizations.

## 2019-06-28 NOTE — Progress Notes (Signed)
   Subjective:    Patient ID: Chris Shah, male    DOB: [birth year], 43 y.o.   MRN: [id number]  Patient presents for Annual Exam (is fasting)  No specific concerns    HTN- taking BP at home 125/82 last night,  This morning  138/82   Taking azor   No CP, no SOB   Exercise- a little walking   Changed diet, and lost 10lbs feels well.  Family history reviewed no new updates.  Due for fasting labs today     Does follow with dentist   Review Of Systems:  GEN- denies fatigue, fever, weight loss,weakness, recent illness HEENT- denies eye drainage, change in vision, nasal discharge, CVS- denies chest pain, palpitations RESP- denies SOB, cough, wheeze ABD- denies N/V, change in stools, abd pain GU- denies dysuria, hematuria, dribbling, incontinence MSK- denies joint pain, muscle aches, injury Neuro- denies headache, dizziness, syncope, seizure activity       Objective:    BP 140/90 (BP Location: Right Arm, Patient Position: Sitting, Cuff Size: Normal)   Pulse 72   Temp 98.1 F (36.7 C) (Oral)   Resp 14   Ht 5\' 10"  (1.778 m)   Wt 203 lb (92.1 kg)   SpO2 98%   BMI 29.13 kg/m  GEN- NAD, alert and oriented x3 HEENT- PERRL, EOMI, non injected sclera, pink conjunctiva, MMM, oropharynx clear, Tm clear bilat, no effusion  Neck- Supple, no thyromegaly CVS- RRR, no murmur RESP-CTAB ABD-NABS,soft,NT,ND EXT- No edema Pulses- Radial, DP- 2+   Fall depression/depression/audit C screening negative     Assessment & Plan:      Problem List Items Addressed This Visit      Unprioritized   Hyperlipidemia   Relevant Orders   Lipid panel   Hypertension    Blood pressure is improved from his last visit.  His home readings are much better.  At this time as he is working on significant dietary changes trying to lose weight I am not going to add any additional medication have him continue to monitor at home.  He can call us if he does get reconsiders staying above 140/90.   Fasting labs to be obtained today.  He declines immunizations.       Other Visit Diagnoses    Routine general medical examination at a health care facility    -  Primary   CPE done, declines immunizations, discussed some vitamins he can take for overall immune health   Relevant Orders   Comprehensive metabolic panel   CBC with Differential/Platelet      Note: This dictation was prepared with Dragon dictation along with smaller phrase technology. Any transcriptional errors that result from this process are unintentional.

## 2019-06-28 NOTE — Patient Instructions (Signed)
F/U 6 months  Vitamin D, Vitamin C, zinc

## 2019-06-29 LAB — LIPID PANEL
Cholesterol: 232 mg/dL — ABNORMAL HIGH (ref <200)
HDL: 56 mg/dL (ref >=40)
LDL Cholesterol (Calc): 156 mg/dL (calc) — ABNORMAL HIGH
Non-HDL Cholesterol (Calc): 176 mg/dL (calc) — ABNORMAL HIGH (ref <130)
Total CHOL/HDL Ratio: 4.1 (calc) (ref <5.0)
Triglycerides: 91 mg/dL (ref <150)

## 2019-06-29 LAB — COMPREHENSIVE METABOLIC PANEL
AG Ratio: 1.6 (calc) (ref 1.0–2.5)
ALT: 26 U/L (ref 9–46)
AST: 29 U/L (ref 10–40)
Albumin: 4.2 g/dL (ref 3.6–5.1)
Alkaline phosphatase (APISO): 55 U/L (ref 36–130)
BUN: 12 mg/dL (ref 7–25)
CO2: 25 mmol/L (ref 20–32)
Calcium: 9.7 mg/dL (ref 8.6–10.3)
Chloride: 101 mmol/L (ref 98–110)
Creat: 0.89 mg/dL (ref 0.60–1.35)
Globulin: 2.6 g/dL (calc) (ref 1.9–3.7)
Glucose, Bld: 98 mg/dL (ref 65–99)
Potassium: 4.6 mmol/L (ref 3.5–5.3)
Sodium: 136 mmol/L (ref 135–146)
Total Bilirubin: 1 mg/dL (ref 0.2–1.2)
Total Protein: 6.8 g/dL (ref 6.1–8.1)

## 2019-06-29 LAB — CBC WITH DIFFERENTIAL/PLATELET
Absolute Monocytes: 718 cells/uL (ref 200–950)
Basophils Absolute: 28 cells/uL (ref 0–200)
Basophils Relative: 0.4 %
Eosinophils Absolute: 110 cells/uL (ref 15–500)
Eosinophils Relative: 1.6 %
HCT: 45.1 % (ref 38.5–50.0)
Hemoglobin: 14.9 g/dL (ref 13.2–17.1)
Lymphs Abs: 2463 cells/uL (ref 850–3900)
MCH: 26.8 pg — ABNORMAL LOW (ref 27.0–33.0)
MCHC: 33 g/dL (ref 32.0–36.0)
MCV: 81.1 fL (ref 80.0–100.0)
MPV: 9.3 fL (ref 7.5–12.5)
Monocytes Relative: 10.4 %
Neutro Abs: 3581 cells/uL (ref 1500–7800)
Neutrophils Relative %: 51.9 %
Platelets: 304 10*3/uL (ref 140–400)
RBC: 5.56 10*6/uL (ref 4.20–5.80)
RDW: 14 % (ref 11.0–15.0)
Total Lymphocyte: 35.7 %
WBC: 6.9 10*3/uL (ref 3.8–10.8)

## 2019-07-03 ENCOUNTER — Encounter: Payer: Self-pay | Admitting: *Deleted

## 2020-01-02 ENCOUNTER — Other Ambulatory Visit: Payer: Self-pay | Admitting: Family Medicine

## 2020-06-10 ENCOUNTER — Encounter: Payer: Self-pay | Admitting: Family Medicine

## 2020-06-10 ENCOUNTER — Ambulatory Visit: Payer: BC Managed Care – PPO | Admitting: Family Medicine

## 2020-06-10 ENCOUNTER — Other Ambulatory Visit: Payer: Self-pay

## 2020-06-10 VITALS — BP 174/108 | HR 72 | Temp 97.8°F | Resp 14 | Ht 70.0 in | Wt 216.0 lb

## 2020-06-10 DIAGNOSIS — I10 Essential (primary) hypertension: Secondary | ICD-10-CM

## 2020-06-10 DIAGNOSIS — E669 Obesity, unspecified: Secondary | ICD-10-CM

## 2020-06-10 DIAGNOSIS — I498 Other specified cardiac arrhythmias: Secondary | ICD-10-CM

## 2020-06-10 DIAGNOSIS — E782 Mixed hyperlipidemia: Secondary | ICD-10-CM

## 2020-06-10 DIAGNOSIS — Z8249 Family history of ischemic heart disease and other diseases of the circulatory system: Secondary | ICD-10-CM

## 2020-06-10 MED ORDER — AMLODIPINE BESYLATE 5 MG PO TABS: 5.0000 mg | ORAL_TABLET | Freq: Every day | ORAL | 3 refills | Status: DC

## 2020-06-10 MED ORDER — METOPROLOL SUCCINATE ER 25 MG PO TB24: 25.0000 mg | ORAL_TABLET | Freq: Every day | ORAL | 3 refills | Status: DC

## 2020-06-10 NOTE — Patient Instructions (Addendum)
We will call with lab results  Start metoprolol 25mg  XL Start norvasc 5mg  once a day  Goal < 140/90 F/U 2 weeks

## 2020-06-10 NOTE — Progress Notes (Addendum)
   Subjective:    Patient ID: Chris Shah, male    DOB: Apr 05, 1976, 44 y.o.   MRN: [id number]  Patient presents for Follow-up (is fasting) Here to follow-up blood pressure.  This visit was at his physical 1 year ago. Now driving long distances  For the past year, he has been eating out more, with more carbs and salt in diet, he started changing his diet about 2 weeks ago, drinking more water and eating more chicken He has not had chest pain with exertion but has had some episodes where if he eats late will have a fluttering in chest, occ need to belch, other times no association with eating and will have fluttering in chest  His mother has A Fib.  He stopped taking bp meds months ago, felt he was doing better, had lost 10lbs, recent started checking and noted it was very high Also due for DOT exam  No SOB  Review Of Systems:  GEN- denies fatigue, fever, weight loss,weakness, recent illness HEENT- denies eye drainage, change in vision, nasal discharge, CVS- denies chest pain,+ palpitations RESP- denies SOB, cough, wheeze ABD- denies N/V, change in stools, abd pain GU- denies dysuria, hematuria, dribbling, incontinence MSK- denies joint pain, muscle aches, injury Neuro- denies headache, dizziness, syncope, seizure activity       Objective:    BP (!) 174/108 (BP Location: Right Arm, Patient Position: Sitting, Cuff Size: Normal)   Pulse 72   Temp 97.8 F (36.6 C) (Temporal)   Resp 14   Ht 5\' 10"  (1.778 m)   Wt 216 lb (98 kg)   SpO2 98%   BMI 30.99 kg/m  GEN- NAD, alert and oriented x3 HEENT- PERRL, EOMI, non injected sclera, pink conjunctiva, MMM, oropharynx clear Neck- Supple, no thyromegaly CVS- RRR, no murmur RESP-CTAB ABD-NABS,soft,NT,ND EXT- No edema Pulses- Radial, DP- 2+   EKG-  NSR, no ST changes      Assessment & Plan:     F/U 2 weeks for BP check   Problem List Items Addressed This Visit      Unprioritized   Class 1 obesity   Hyperlipidemia     Discussed dietary changes, 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 Review lipids, if still elevated start statin drug        Relevant Medications   metoprolol succinate (TOPROL-XL) 25 MG 24 hr tablet   amLODipine (NORVASC) 5 MG tablet   Other Relevant Orders   Lipid panel (Completed)   Hypertension - Primary    I am concerned about the fluttering issues, he has family history of heart disease He may need monitor placed to capture these episodes Will refer to cardiology  Will start Toprol 25mg  XL , to for bp and fluttering Restart norvasc at 5mg  once a day   Labs checked including TSH        Relevant Medications   metoprolol succinate (TOPROL-XL) 25 MG 24 hr tablet   amLODipine (NORVASC) 5 MG tablet   Other Relevant Orders   Comprehensive metabolic panel (Completed)   CBC with Differential/Platelet (Completed)   TSH (Completed)   EKG 12-Lead (Completed)    Other Visit Diagnoses    Fluttering heart       Relevant Orders   TSH (Completed)   EKG 12-Lead (Completed)      Note: This dictation was prepared with Dragon dictation along with smaller phrase technology. Any transcriptional errors that result from this process are unintentional.

## 2020-06-11 ENCOUNTER — Encounter: Payer: Self-pay | Admitting: Family Medicine

## 2020-06-11 LAB — LIPID PANEL
Cholesterol: 222 mg/dL — ABNORMAL HIGH (ref <200)
HDL: 48 mg/dL (ref >=40)
LDL Cholesterol (Calc): 151 mg/dL (calc) — ABNORMAL HIGH
Non-HDL Cholesterol (Calc): 174 mg/dL (calc) — ABNORMAL HIGH (ref <130)
Total CHOL/HDL Ratio: 4.6 (calc) (ref <5.0)
Triglycerides: 110 mg/dL (ref <150)

## 2020-06-11 LAB — CBC WITH DIFFERENTIAL/PLATELET
Absolute Monocytes: 748 cells/uL (ref 200–950)
Basophils Absolute: 41 cells/uL (ref 0–200)
Basophils Relative: 0.6 %
Eosinophils Absolute: 109 cells/uL (ref 15–500)
Eosinophils Relative: 1.6 %
HCT: 43.2 % (ref 38.5–50.0)
Hemoglobin: 14.3 g/dL (ref 13.2–17.1)
Lymphs Abs: 2686 cells/uL (ref 850–3900)
MCH: 26.8 pg — ABNORMAL LOW (ref 27.0–33.0)
MCHC: 33.1 g/dL (ref 32.0–36.0)
MCV: 81.1 fL (ref 80.0–100.0)
MPV: 9.9 fL (ref 7.5–12.5)
Monocytes Relative: 11 %
Neutro Abs: 3216 cells/uL (ref 1500–7800)
Neutrophils Relative %: 47.3 %
Platelets: 285 10*3/uL (ref 140–400)
RBC: 5.33 10*6/uL (ref 4.20–5.80)
RDW: 13.5 % (ref 11.0–15.0)
Total Lymphocyte: 39.5 %
WBC: 6.8 10*3/uL (ref 3.8–10.8)

## 2020-06-11 LAB — COMPREHENSIVE METABOLIC PANEL
AG Ratio: 1.6 (calc) (ref 1.0–2.5)
ALT: 33 U/L (ref 9–46)
AST: 38 U/L (ref 10–40)
Albumin: 4.1 g/dL (ref 3.6–5.1)
Alkaline phosphatase (APISO): 54 U/L (ref 36–130)
BUN: 15 mg/dL (ref 7–25)
CO2: 28 mmol/L (ref 20–32)
Calcium: 9.5 mg/dL (ref 8.6–10.3)
Chloride: 102 mmol/L (ref 98–110)
Creat: 1.01 mg/dL (ref 0.60–1.35)
Globulin: 2.5 g/dL (calc) (ref 1.9–3.7)
Glucose, Bld: 92 mg/dL (ref 65–99)
Potassium: 4.1 mmol/L (ref 3.5–5.3)
Sodium: 137 mmol/L (ref 135–146)
Total Bilirubin: 1 mg/dL (ref 0.2–1.2)
Total Protein: 6.6 g/dL (ref 6.1–8.1)

## 2020-06-11 LAB — TSH: TSH: 1.28 mIU/L (ref 0.40–4.50)

## 2020-06-11 NOTE — Assessment & Plan Note (Signed)
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

## 2020-06-11 NOTE — Assessment & Plan Note (Signed)
I am concerned about the fluttering issues, he has family history of heart disease He may need monitor placed to capture these episodes Will refer to cardiology  Will start Toprol 25mg  XL , to for bp and fluttering Restart norvasc at 5mg  once a day   Labs checked including TSH

## 2020-06-16 ENCOUNTER — Other Ambulatory Visit: Payer: Self-pay | Admitting: *Deleted

## 2020-06-16 MED ORDER — ATORVASTATIN CALCIUM 10 MG PO TABS: 10.0000 mg | ORAL_TABLET | Freq: Every day | ORAL | 3 refills | Status: DC

## 2020-06-30 ENCOUNTER — Ambulatory Visit: Payer: Self-pay | Admitting: Family Medicine

## 2020-08-11 ENCOUNTER — Other Ambulatory Visit: Payer: Self-pay

## 2020-08-16 NOTE — Progress Notes (Deleted)
Cardiology Office Note:    Date:  08/16/2020   ID:  Chris Shah, DOB 08-Jun-1976, MRN [id number]  PCP:  Salley Scarlet, MD  [facility] Cardiologist:  No primary care provider on file.  [facility] Electrophysiologist:  None   Referring MD: Salley Scarlet, MD    History of Present Illness:    Chris Shah is a 44 y.o. male with a hx of HTN and HLD who was referred by Dr. Jeanice Lim for further evaluation of palpitations.  Patient recently seen by Dr. Jeanice Lim for evaluation of heart "flutters." Was placed on toprolol 25mg  XL and referred to Cardiology for further management.  Past Medical History:  Diagnosis Date  . Eczema   . Hyperlipidemia   . Hypertension     Past Surgical History:  Procedure Laterality Date  . VASECTOMY      Current Medications: No outpatient medications have been marked as taking for the 08/17/20 encounter (Appointment) with 13/8/21, MD.     Allergies:   Patient has no known allergies.   Social History   Socioeconomic History  . Marital status: Married    Spouse name: KIMBERLY  . Number of children: 3  . Years of education: 34  . Highest education level: High school graduate  Occupational History  . Occupation: 14  Tobacco Use  . Smoking status: Never Smoker  . Smokeless tobacco: Never Used  Vaping Use  . Vaping Use: Never used  Substance and Sexual Activity  . Alcohol use: Yes    Comment: occasionally  . Drug use: Never  . Sexual activity: Yes    Birth control/protection: Surgical  Other Topics Concern  . Not on file  Social History Narrative  . Not on file   Social Determinants of Health   Financial Resource Strain:   . Difficulty of Paying Living Expenses: Not on file  Food Insecurity:   . Worried About Hospital doctor in the Last Year: Not on file  . Ran Out of Food in the Last Year: Not on file  Transportation Needs:   . Lack of Transportation (Medical): Not on file  . Lack of  Transportation (Non-Medical): Not on file  Physical Activity:   . Days of Exercise per Week: Not on file  . Minutes of Exercise per Session: Not on file  Stress:   . Feeling of Stress : Not on file  Social Connections:   . Frequency of Communication with Friends and Family: Not on file  . Frequency of Social Gatherings with Friends and Family: Not on file  . Attends Religious Services: Not on file  . Active Member of Clubs or Organizations: Not on file  . Attends Programme researcher, broadcasting/film/video Meetings: Not on file  . Marital Status: Not on file     Family History: The patient's ***family history includes Arthritis in his father; Atrial fibrillation in his mother; Diabetes in his maternal grandmother; Diverticulitis in his mother and sister; Heart disease in his maternal grandfather and maternal grandmother; Hypertension in his mother.  ROS:   Please see the history of present illness.    *** All other systems reviewed and are negative.  EKGs/Labs/Other Studies Reviewed:    The following studies were reviewed today: CT Abdomen/pelvis 2019: Lower Chest: No acute findings.  Hepatobiliary: No hepatic masses identified. Gallbladder is unremarkable.  Pancreas:  No mass or inflammatory changes.  Spleen: Within normal limits in size and appearance.  Adrenals/Urinary Tract: No masses identified. No evidence of hydronephrosis.  Unremarkable unopacified urinary bladder.  Stomach/Bowel: No evidence of obstruction, inflammatory process or abnormal fluid collections. Normal appendix visualized.  Vascular/Lymphatic: No pathologically enlarged lymph nodes. No abdominal aortic aneurysm.  Reproductive:  No mass or other significant abnormality.  Other:  None.  Musculoskeletal:  No suspicious bone lesions identified.  IMPRESSION: Negative.  No acute findings or other significant abnormality.  EKG:  EKG is *** ordered today.  The ekg ordered today demonstrates ***  Recent  Labs: 06/10/2020: ALT 33; BUN 15; Creat 1.01; Hemoglobin 14.3; Platelets 285; Potassium 4.1; Sodium 137; TSH 1.28  Recent Lipid Panel    Component Value Date/Time   CHOL 222 (H) 06/10/2020 1155   TRIG 110 06/10/2020 1155   HDL 48 06/10/2020 1155   CHOLHDL 4.6 06/10/2020 1155   LDLCALC 151 (H) 06/10/2020 1155     Risk Assessment/Calculations:   {Does this patient have ATRIAL FIBRILLATION?:[id number]}   Physical Exam:    VS:  There were no vitals taken for this visit.    Wt Readings from Last 3 Encounters:  06/10/20 216 lb (98 kg)  06/28/19 203 lb (92.1 kg)  03/12/19 214 lb (97.1 kg)     GEN: *** Well nourished, well developed in no acute distress HEENT: Normal NECK: No JVD; No carotid bruits LYMPHATICS: No lymphadenopathy CARDIAC: ***RRR, no murmurs, rubs, gallops RESPIRATORY:  Clear to auscultation without rales, wheezing or rhonchi  ABDOMEN: Soft, non-tender, non-distended MUSCULOSKELETAL:  No edema; No deformity  SKIN: Warm and dry NEUROLOGIC:  Alert and oriented x 3 PSYCHIATRIC:  Normal affect   ASSESSMENT:    No diagnosis found. PLAN:    In order of problems listed above:  #Heart palpitations: -Zio -Continue toprolol    Shared Decision Making/Informed Consent      Medication Adjustments/Labs and Tests Ordered: Current medicines are reviewed at length with the patient today.  Concerns regarding medicines are outlined above.  No orders of the defined types were placed in this encounter.  No orders of the defined types were placed in this encounter.   There are no Patient Instructions on file for this visit.   Signed, Meriam Sprague, MD  08/16/2020 3:40 PM    [facility]

## 2020-08-17 ENCOUNTER — Ambulatory Visit: Payer: Self-pay | Admitting: Cardiology

## 2020-08-17 ENCOUNTER — Other Ambulatory Visit: Payer: Self-pay

## 2020-08-30 NOTE — Progress Notes (Signed)
Cardiology Office Note:    Date:  09/01/2020   ID:  Chris Shah, DOB 03-10-1976, MRN [id number]  PCP:  Salley Scarlet, MD  [facility] Cardiologist:  No primary care provider on file.  [facility] Electrophysiologist:  None   Referring MD: Salley Scarlet, MD     History of Present Illness:    Chris Shah is a 44 y.o. male with a hx of HTN and HLD who was referred by Dr. Jeanice Lim for further evaluation of palpitations.  Patient recently seen by Dr. Jeanice Lim for evaluation of HTN and heart "flutters." Was placed on toprolol 25mg  XL and amlodipine 5mg  and referred to Cardiology for further management.  The patient states that when he lays down at night, he was having some "heart flutters." Thought it may be due to reflux as he was eating late and fried food. He cut out those foods and the flutters have resolved (gone for 2-3 weeks). No exertional symptoms. No chest pain, SOB, LE edema, orthopnea, lightheadedness, dizziness, nausea/vomiting. No bleeding issues. Has occasional headaches. Blood pressure is not well controlled at home and is currently 180/110.  Family history notable for maternal GM: HTN, MI with CAD (in 74s) maternal GF: MI with CAD (age 32); Mother: Afib; Father: healthy.  TC 232, HDL 56, LDL 156, TG 91.  ASCVD risk score 10.8% with BP 174/108 at visit on 06/10/20   Past Medical History:  Diagnosis Date  . Eczema   . Hyperlipidemia   . Hypertension     Past Surgical History:  Procedure Laterality Date  . VASECTOMY      Current Medications: Current Meds  Medication Sig  . [DISCONTINUED] amLODipine (NORVASC) 5 MG tablet Take 1 tablet (5 mg total) by mouth daily.  . [DISCONTINUED] atorvastatin (LIPITOR) 10 MG tablet Take 1 tablet (10 mg total) by mouth daily.  . [DISCONTINUED] metoprolol succinate (TOPROL-XL) 25 MG 24 hr tablet Take 1 tablet (25 mg total) by mouth daily.     Allergies:   Patient has no known allergies.   Social History    Socioeconomic History  . Marital status: Married    Spouse name: KIMBERLY  . Number of children: 3  . Years of education: 57  . Highest education level: High school graduate  Occupational History  . Occupation: 08/10/20  Tobacco Use  . Smoking status: Never Smoker  . Smokeless tobacco: Never Used  Vaping Use  . Vaping Use: Never used  Substance and Sexual Activity  . Alcohol use: Yes    Comment: occasionally  . Drug use: Never  . Sexual activity: Yes    Birth control/protection: Surgical  Other Topics Concern  . Not on file  Social History Narrative  . Not on file   Social Determinants of Health   Financial Resource Strain:   . Difficulty of Paying Living Expenses: Not on file  Food Insecurity:   . Worried About 14 in the Last Year: Not on file  . Ran Out of Food in the Last Year: Not on file  Transportation Needs:   . Lack of Transportation (Medical): Not on file  . Lack of Transportation (Non-Medical): Not on file  Physical Activity:   . Days of Exercise per Week: Not on file  . Minutes of Exercise per Session: Not on file  Stress:   . Feeling of Stress : Not on file  Social Connections:   . Frequency of Communication with Friends and Family: Not on file  .  Frequency of Social Gatherings with Friends and Family: Not on file  . Attends Religious Services: Not on file  . Active Member of Clubs or Organizations: Not on file  . Attends Banker Meetings: Not on file  . Marital Status: Not on file     Family History: The patient's family history includes Arthritis in his father; Atrial fibrillation in his mother; Diabetes in his maternal grandmother; Diverticulitis in his mother and sister; Heart disease in his maternal grandfather and maternal grandmother; Hypertension in his mother.  ROS:   Please see the history of present illness.    Review of Systems  Constitutional: Negative for chills and fever.  HENT: Negative for hearing  loss.   Eyes: Negative for blurred vision.  Respiratory: Negative for shortness of breath.   Cardiovascular: Positive for palpitations. Negative for chest pain, orthopnea, claudication, leg swelling and PND.  Gastrointestinal: Positive for heartburn. Negative for nausea and vomiting.  Genitourinary: Negative for hematuria.  Musculoskeletal: Negative for myalgias.  Neurological: Positive for headaches. Negative for dizziness and loss of consciousness.  Endo/Heme/Allergies: Negative for polydipsia.  Psychiatric/Behavioral: Negative for substance abuse.    EKGs/Labs/Other Studies Reviewed:    The following studies were reviewed today: CT Abdomen/pelvis 2019: Lower Chest: No acute findings.  Hepatobiliary: No hepatic masses identified. Gallbladder is unremarkable.  Pancreas: No mass or inflammatory changes.  Spleen: Within normal limits in size and appearance.  Adrenals/Urinary Tract: No masses identified. No evidence of hydronephrosis. Unremarkable unopacified urinary bladder.  Stomach/Bowel: No evidence of obstruction, inflammatory process or abnormal fluid collections. Normal appendix visualized.  Vascular/Lymphatic: No pathologically enlarged lymph nodes. No abdominal aortic aneurysm.  Reproductive: No mass or other significant abnormality.  Other: None.  Musculoskeletal: No suspicious bone lesions identified.  IMPRESSION: Negative. No acute findings or other significant abnormality  EKG:  EKG is  ordered today.  The ekg ordered today demonstrates NSR with HR 75; non-specific ST-T wave changes  Recent Labs: 06/10/2020: ALT 33; BUN 15; Creat 1.01; Hemoglobin 14.3; Platelets 285; Potassium 4.1; Sodium 137; TSH 1.28  Recent Lipid Panel    Component Value Date/Time   CHOL 222 (H) 06/10/2020 1155   TRIG 110 06/10/2020 1155   HDL 48 06/10/2020 1155   CHOLHDL 4.6 06/10/2020 1155   LDLCALC 151 (H) 06/10/2020 1155       Physical Exam:    VS:  BP (!)  180/110   Pulse 74   Ht  (1.778 m)   Wt 218 lb (98.9 kg)   SpO2 98%   BMI 31.28 kg/m     Wt Readings from Last 3 Encounters:  09/01/20 218 lb (98.9 kg)  06/10/20 216 lb (98 kg)  06/28/19 203 lb (92.1 kg)     GEN:  Well nourished, well developed in no acute distress HEENT: Normal NECK: No JVD; No carotid bruits CARDIAC: RRR, no murmurs, rubs, gallops RESPIRATORY:  Clear to auscultation without rales, wheezing or rhonchi  ABDOMEN: Soft, non-tender, non-distended MUSCULOSKELETAL:  No edema; No deformity  SKIN: Warm and dry NEUROLOGIC:  Alert and oriented x 3 PSYCHIATRIC:  Normal affect   ASSESSMENT:    1. Primary hypertension   2. Mixed hyperlipidemia   3. Medication management    PLAN:    In order of problems listed above:  #Heart palpitations: Resolved with changes in his diet. No further episodes for the last 2-3 weeks.  -Will continue to monitor -He wants to trial off beta blocker due to side-effects and monitor for palpitations -  If recur, will plan on Zio and can re-start BB at this time -Agree with dietary changes (cutting out fried foods and avoiding late night meals) as this helps prevent reflux as well as promotes CV health  #Hypertension: Poorly controlled. Very elevated today at 180/110. -Increase amlodipine to 10mg  daily -Start HCTZ 25mg  daily -Hold BB per patient's preference; if palpitations recur--can resume (coreg if BP elevated at that time) -Consider ACE/ARB pending response to blood pressure medications as detailed above -Follow-up in HTN clinic -Will re-check BMET at time of HTN clinic follow-up to monitor renal function/electrolytes on HCTZ  #HLD: ASCVD risk 10.8% with TC 232, HDL 56, LDL 156, TG 91.  -Increase atorvastatin to 20mg  daily -Spoke extensively about diet and exercise as detailed below  Exercise recommendations: Goal of exercising for at least 30 minutes a day, at least 5 times per week.  Please exercise to a moderate  exertion.  This means that while exercising it is difficult to speak in full sentences, however you are not so short of breath that you feel you must stop, and not so comfortable that you can carry on a full conversation.  Exertion level should be approximately a 5/10, if 10 is the most exertion you can perform.  Diet recommendations: Recommend a heart healthy diet such as the Mediterranean diet.  This diet consists of plant based foods, healthy fats, lean meats, olive oil.  It suggests limiting the intake of simple carbohydrates such as white breads, pastries, and pastas.  It also limits the amount of red meat, wine, and dairy products such as cheese that one should consume on a daily basis.    Medication Adjustments/Labs and Tests Ordered: Current medicines are reviewed at length with the patient today.  Concerns regarding medicines are outlined above.  Orders Placed This Encounter  Procedures  . Basic metabolic panel  . AMB Referral to [facility]  . EKG 12-Lead   Meds ordered this encounter  Medications  . amLODipine (NORVASC) 10 MG tablet    Sig: Take 1 tablet (10 mg total) by mouth daily.    Dispense:  90 tablet    Refill:  1    Dose increase  . hydrochlorothiazide (HYDRODIURIL) 25 MG tablet    Sig: Take 1 tablet (25 mg total) by mouth daily.    Dispense:  90 tablet    Refill:  1  . atorvastatin (LIPITOR) 20 MG tablet    Sig: Take 1 tablet (20 mg total) by mouth daily.    Dispense:  90 tablet    Refill:  1    Dose increase    Patient Instructions  Medication Instructions:   STOP TAKING METOPROLOL SUCCINATE NOW  START TAKING HYDROCHLOROTHIAZIDE 25 MG BY MOUTH DAILY  INCREASE YOUR AMLODIPINE TO 10 MG BY MOUTH DAILY  INCREASE YOUR ATORVASTATIN TO 20 MG BY MOUTH DAILY  *If you need a refill on your cardiac medications before your next appointment, please call your pharmacy*   You have been referred to [facility] HERE IN THE OFFICE TO SEE OUR  PHARMACIST--PLEASE SCHEDULE THIS FOR NEXT WEEK AND HAVE LAB (BMET) DONE SAME DAY.   Lab Work:  [facility]--SAME DAY AS YOU COME INTO THE OFFICE TO SEE OUR PHARMACIST IN [facility] NEXT WEEK  If you have labs (blood work) drawn today and your tests are completely normal, you will receive your results only by: Marland Kitchen. MyChart Message (if you have MyChart) OR . A paper copy in the mail If you  have any lab test that is abnormal or we need to change your treatment, we will call you to review the results.   Follow-Up:  3 MONTHS IN THE OFFICE WITH DR. Shari Prows     Other Instructions  Mediterranean Diet A Mediterranean diet refers to food and lifestyle choices that are based on the traditions of countries located on the [place]. This way of eating has been shown to help prevent certain conditions and improve outcomes for people who have chronic diseases, like kidney disease and heart disease. What are tips for following this plan? Lifestyle  Cook and eat meals together with your family, when possible.  Drink enough fluid to keep your urine clear or pale yellow.  Be physically active every day. This includes: ? Aerobic exercise like running or swimming. ? Leisure activities like gardening, walking, or housework.  Get 7-8 hours of sleep each night.  If recommended by your health care provider, drink red wine in moderation. This means 1 glass a day for nonpregnant women and 2 glasses a day for men. A glass of wine equals 5 oz (150 mL). Reading food labels    Check the serving size of packaged foods. For foods such as rice and pasta, the serving size refers to the amount of cooked product, not dry.  Check the total fat in packaged foods. Avoid foods that have saturated fat or trans fats.  Check the ingredients list for added sugars, such as corn syrup. Shopping  At the grocery store, buy most of your food from the areas near the walls of the store. This includes: ? Fresh fruits  and vegetables (produce). ? Grains, beans, nuts, and seeds. Some of these may be available in unpackaged forms or large amounts (in bulk). ? Fresh seafood. ? Poultry and eggs. ? Low-fat dairy products.  Buy whole ingredients instead of prepackaged foods.  Buy fresh fruits and vegetables in-season from local farmers markets.  Buy frozen fruits and vegetables in resealable bags.  If you do not have access to quality fresh seafood, buy precooked frozen shrimp or canned fish, such as tuna, salmon, or sardines.  Buy small amounts of raw or cooked vegetables, salads, or olives from the deli or salad bar at your store.  Stock your pantry so you always have certain foods on hand, such as olive oil, canned tuna, canned tomatoes, rice, pasta, and beans. Cooking  Cook foods with extra-virgin olive oil instead of using butter or other vegetable oils.  Have meat as a side dish, and have vegetables or grains as your main dish. This means having meat in small portions or adding small amounts of meat to foods like pasta or stew.  Use beans or vegetables instead of meat in common dishes like chili or lasagna.  Experiment with different cooking methods. Try roasting or broiling vegetables instead of steaming or sauteing them.  Add frozen vegetables to soups, stews, pasta, or rice.  Add nuts or seeds for added healthy fat at each meal. You can add these to yogurt, salads, or vegetable dishes.  Marinate fish or vegetables using olive oil, lemon juice, garlic, and fresh herbs. Meal planning    Plan to eat 1 vegetarian meal one day each week. Try to work up to 2 vegetarian meals, if possible.  Eat seafood 2 or more times a week.  Have healthy snacks readily available, such as: ? Vegetable sticks with hummus. ? [place] yogurt. ? Fruit and nut trail mix.  Eat balanced meals throughout  the week. This includes: ? Fruit: 2-3 servings a day ? Vegetables: 4-5 servings a day ? Low-fat dairy: 2  servings a day ? Fish, poultry, or lean meat: 1 serving a day ? Beans and legumes: 2 or more servings a week ? Nuts and seeds: 1-2 servings a day ? Whole grains: 6-8 servings a day ? Extra-virgin olive oil: 3-4 servings a day  Limit red meat and sweets to only a few servings a month What are my food choices?  Mediterranean diet ? Recommended  Grains: Whole-grain pasta. Brown rice. Bulgar wheat. Polenta. Couscous. Whole-wheat bread. Orpah Cobb.  Vegetables: Artichokes. Beets. Broccoli. Cabbage. Carrots. Eggplant. Green beans. Chard. Kale. Spinach. Onions. Leeks. Peas. Squash. Tomatoes. Peppers. Radishes.  Fruits: Apples. Apricots. Avocado. Berries. Bananas. Cherries. Dates. Figs. Grapes. Lemons. Melon. Oranges. Peaches. Plums. Pomegranate.  Meats and other protein foods: Beans. Almonds. Sunflower seeds. Pine nuts. Peanuts. Cod. Salmon. Scallops. Shrimp. Tuna. Tilapia. Clams. Oysters. Eggs.  Dairy: Low-fat milk. Cheese. Greek yogurt.  Beverages: Water. Red wine. Herbal tea.  Fats and oils: Extra virgin olive oil. Avocado oil. Grape seed oil.  Sweets and desserts: [place] yogurt with honey. Baked apples. Poached pears. Trail mix.  Seasoning and other foods: Basil. Cilantro. Coriander. Cumin. Mint. Parsley. Sage. Rosemary. Tarragon. Garlic. Oregano. Thyme. Pepper. Balsalmic vinegar. Tahini. Hummus. Tomato sauce. Olives. Mushrooms. ? Limit these  Grains: Prepackaged pasta or rice dishes. Prepackaged cereal with added sugar.  Vegetables: Deep fried potatoes (french fries).  Fruits: Fruit canned in syrup.  Meats and other protein foods: Beef. Pork. Lamb. Poultry with skin. Hot dogs. Tomasa Blase.  Dairy: Ice cream. Sour cream. Whole milk.  Beverages: Juice. Sugar-sweetened soft drinks. Beer. Liquor and spirits.  Fats and oils: Butter. Canola oil. Vegetable oil. Beef fat (tallow). Lard.  Sweets and desserts: Cookies. Cakes. Pies. Candy.  Seasoning and other foods: Mayonnaise.  Premade sauces and marinades. The items listed may not be a complete list. Talk with your dietitian about what dietary choices are right for you. Summary  The Mediterranean diet includes both food and lifestyle choices.  Eat a variety of fresh fruits and vegetables, beans, nuts, seeds, and whole grains.  Limit the amount of red meat and sweets that you eat.  Talk with your health care provider about whether it is safe for you to drink red wine in moderation. This means 1 glass a day for nonpregnant women and 2 glasses a day for men. A glass of wine equals 5 oz (150 mL). This information is not intended to replace advice given to you by your health care provider. Make sure you discuss any questions you have with your health care provider. Document Revised: 05/26/2016 Document Reviewed: 05/19/2016 Elsevier Patient Education  2020 [employer].       Signed, Meriam Sprague, MD  09/01/2020 9:41 AM    [facility]

## 2020-09-01 ENCOUNTER — Other Ambulatory Visit: Payer: Self-pay

## 2020-09-01 ENCOUNTER — Encounter: Payer: Self-pay | Admitting: Cardiology

## 2020-09-01 ENCOUNTER — Ambulatory Visit (INDEPENDENT_AMBULATORY_CARE_PROVIDER_SITE_OTHER): Payer: Self-pay | Admitting: Cardiology

## 2020-09-01 VITALS — BP 180/110 | HR 74 | Ht 70.0 in | Wt 218.0 lb

## 2020-09-01 DIAGNOSIS — E782 Mixed hyperlipidemia: Secondary | ICD-10-CM

## 2020-09-01 DIAGNOSIS — I10 Essential (primary) hypertension: Secondary | ICD-10-CM

## 2020-09-01 DIAGNOSIS — Z79899 Other long term (current) drug therapy: Secondary | ICD-10-CM

## 2020-09-01 MED ORDER — HYDROCHLOROTHIAZIDE 25 MG PO TABS: 25.0000 mg | ORAL_TABLET | Freq: Every day | ORAL | 1 refills | Status: DC

## 2020-09-01 MED ORDER — AMLODIPINE BESYLATE 10 MG PO TABS: 10.0000 mg | ORAL_TABLET | Freq: Every day | ORAL | 1 refills | Status: DC

## 2020-09-01 MED ORDER — ATORVASTATIN CALCIUM 20 MG PO TABS: 20.0000 mg | ORAL_TABLET | Freq: Every day | ORAL | 1 refills | Status: AC

## 2020-09-01 NOTE — Patient Instructions (Signed)
Medication Instructions:   STOP TAKING METOPROLOL SUCCINATE NOW  START TAKING HYDROCHLOROTHIAZIDE 25 MG BY MOUTH DAILY  INCREASE YOUR AMLODIPINE TO 10 MG BY MOUTH DAILY  INCREASE YOUR ATORVASTATIN TO 20 MG BY MOUTH DAILY  *If you need a refill on your cardiac medications before your next appointment, please call your pharmacy*   You have been referred to [facility] HERE IN THE OFFICE TO SEE OUR PHARMACIST--PLEASE SCHEDULE THIS FOR NEXT WEEK AND HAVE LAB (BMET) DONE SAME DAY.   Lab Work:  [employer] DAY AS YOU COME INTO THE OFFICE TO SEE OUR PHARMACIST IN [facility] NEXT WEEK  If you have labs (blood work) drawn today and your tests are completely normal, you will receive your results only by: Marland Kitchen MyChart Message (if you have MyChart) OR . A paper copy in the mail If you have any lab test that is abnormal or we need to change your treatment, we will call you to review the results.   Follow-Up:  3 MONTHS IN THE OFFICE WITH DR. Shari Prows     Other Instructions  Mediterranean Diet A Mediterranean diet refers to food and lifestyle choices that are based on the traditions of countries located on the [place]. This way of eating has been shown to help prevent certain conditions and improve outcomes for people who have chronic diseases, like kidney disease and heart disease. What are tips for following this plan? Lifestyle  Cook and eat meals together with your family, when possible.  Drink enough fluid to keep your urine clear or pale yellow.  Be physically active every day. This includes: ? Aerobic exercise like running or swimming. ? Leisure activities like gardening, walking, or housework.  Get 7-8 hours of sleep each night.  If recommended by your health care provider, drink red wine in moderation. This means 1 glass a day for nonpregnant women and 2 glasses a day for men. A glass of wine equals 5 oz (150 mL). Reading food labels    Check the  serving size of packaged foods. For foods such as rice and pasta, the serving size refers to the amount of cooked product, not dry.  Check the total fat in packaged foods. Avoid foods that have saturated fat or trans fats.  Check the ingredients list for added sugars, such as corn syrup. Shopping  At the grocery store, buy most of your food from the areas near the walls of the store. This includes: ? Fresh fruits and vegetables (produce). ? Grains, beans, nuts, and seeds. Some of these may be available in unpackaged forms or large amounts (in bulk). ? Fresh seafood. ? Poultry and eggs. ? Low-fat dairy products.  Buy whole ingredients instead of prepackaged foods.  Buy fresh fruits and vegetables in-season from local farmers markets.  Buy frozen fruits and vegetables in resealable bags.  If you do not have access to quality fresh seafood, buy precooked frozen shrimp or canned fish, such as tuna, salmon, or sardines.  Buy small amounts of raw or cooked vegetables, salads, or olives from the deli or salad bar at your store.  Stock your pantry so you always have certain foods on hand, such as olive oil, canned tuna, canned tomatoes, rice, pasta, and beans. Cooking  Cook foods with extra-virgin olive oil instead of using butter or other vegetable oils.  Have meat as a side dish, and have vegetables or grains as your main dish. This means having meat in small portions or adding small amounts  of meat to foods like pasta or stew.  Use beans or vegetables instead of meat in common dishes like chili or lasagna.  Experiment with different cooking methods. Try roasting or broiling vegetables instead of steaming or sauteing them.  Add frozen vegetables to soups, stews, pasta, or rice.  Add nuts or seeds for added healthy fat at each meal. You can add these to yogurt, salads, or vegetable dishes.  Marinate fish or vegetables using olive oil, lemon juice, garlic, and fresh herbs. Meal  planning    Plan to eat 1 vegetarian meal one day each week. Try to work up to 2 vegetarian meals, if possible.  Eat seafood 2 or more times a week.  Have healthy snacks readily available, such as: ? Vegetable sticks with hummus. ? [place] yogurt. ? Fruit and nut trail mix.  Eat balanced meals throughout the week. This includes: ? Fruit: 2-3 servings a day ? Vegetables: 4-5 servings a day ? Low-fat dairy: 2 servings a day ? Fish, poultry, or lean meat: 1 serving a day ? Beans and legumes: 2 or more servings a week ? Nuts and seeds: 1-2 servings a day ? Whole grains: 6-8 servings a day ? Extra-virgin olive oil: 3-4 servings a day  Limit red meat and sweets to only a few servings a month What are my food choices?  Mediterranean diet ? Recommended  Grains: Whole-grain pasta. Brown rice. Bulgar wheat. Polenta. Couscous. Whole-wheat bread. Orpah Cobb.  Vegetables: Artichokes. Beets. Broccoli. Cabbage. Carrots. Eggplant. Green beans. Chard. Kale. Spinach. Onions. Leeks. Peas. Squash. Tomatoes. Peppers. Radishes.  Fruits: Apples. Apricots. Avocado. Berries. Bananas. Cherries. Dates. Figs. Grapes. Lemons. Melon. Oranges. Peaches. Plums. Pomegranate.  Meats and other protein foods: Beans. Almonds. Sunflower seeds. Pine nuts. Peanuts. Cod. Salmon. Scallops. Shrimp. Tuna. Tilapia. Clams. Oysters. Eggs.  Dairy: Low-fat milk. Cheese. Greek yogurt.  Beverages: Water. Red wine. Herbal tea.  Fats and oils: Extra virgin olive oil. Avocado oil. Grape seed oil.  Sweets and desserts: [place] yogurt with honey. Baked apples. Poached pears. Trail mix.  Seasoning and other foods: Basil. Cilantro. Coriander. Cumin. Mint. Parsley. Sage. Rosemary. Tarragon. Garlic. Oregano. Thyme. Pepper. Balsalmic vinegar. Tahini. Hummus. Tomato sauce. Olives. Mushrooms. ? Limit these  Grains: Prepackaged pasta or rice dishes. Prepackaged cereal with added sugar.  Vegetables: Deep fried potatoes (french  fries).  Fruits: Fruit canned in syrup.  Meats and other protein foods: Beef. Pork. Lamb. Poultry with skin. Hot dogs. Tomasa Blase.  Dairy: Ice cream. Sour cream. Whole milk.  Beverages: Juice. Sugar-sweetened soft drinks. Beer. Liquor and spirits.  Fats and oils: Butter. Canola oil. Vegetable oil. Beef fat (tallow). Lard.  Sweets and desserts: Cookies. Cakes. Pies. Candy.  Seasoning and other foods: Mayonnaise. Premade sauces and marinades. The items listed may not be a complete list. Talk with your dietitian about what dietary choices are right for you. Summary  The Mediterranean diet includes both food and lifestyle choices.  Eat a variety of fresh fruits and vegetables, beans, nuts, seeds, and whole grains.  Limit the amount of red meat and sweets that you eat.  Talk with your health care provider about whether it is safe for you to drink red wine in moderation. This means 1 glass a day for nonpregnant women and 2 glasses a day for men. A glass of wine equals 5 oz (150 mL). This information is not intended to replace advice given to you by your health care provider. Make sure you discuss any questions you have with your  health care provider. Document Revised: 05/26/2016 Document Reviewed: 05/19/2016 Elsevier Patient Education  2020 [employer].

## 2020-09-07 ENCOUNTER — Other Ambulatory Visit: Payer: Self-pay | Admitting: *Deleted

## 2020-09-07 ENCOUNTER — Other Ambulatory Visit: Payer: Self-pay

## 2020-09-07 ENCOUNTER — Ambulatory Visit (INDEPENDENT_AMBULATORY_CARE_PROVIDER_SITE_OTHER): Payer: Self-pay | Admitting: Pharmacist

## 2020-09-07 VITALS — BP 144/100 | HR 87

## 2020-09-07 DIAGNOSIS — I10 Essential (primary) hypertension: Secondary | ICD-10-CM

## 2020-09-07 DIAGNOSIS — Z79899 Other long term (current) drug therapy: Secondary | ICD-10-CM

## 2020-09-07 DIAGNOSIS — E782 Mixed hyperlipidemia: Secondary | ICD-10-CM

## 2020-09-07 LAB — BASIC METABOLIC PANEL
BUN/Creatinine Ratio: 17 (ref 9–20)
BUN: 18 mg/dL (ref 6–24)
CO2: 27 mmol/L (ref 20–29)
Calcium: 9.7 mg/dL (ref 8.7–10.2)
Chloride: 99 mmol/L (ref 96–106)
Creatinine, Ser: 1.05 mg/dL (ref 0.76–1.27)
GFR calc Af Amer: 100 mL/min/{1.73_m2} (ref >59)
GFR calc non Af Amer: 87 mL/min/{1.73_m2} (ref >59)
Glucose: 115 mg/dL — ABNORMAL HIGH (ref 65–99)
Potassium: 4 mmol/L (ref 3.5–5.2)
Sodium: 139 mmol/L (ref 134–144)

## 2020-09-07 NOTE — Progress Notes (Signed)
Patient ID: Chris Shah                 DOB: 1976-06-01                      MRN: [id number]     HPI: Chris Shah is a 44 y.o. male referred by Dr. Johney Frame to HTN clinic. PMH is significant for HTN, HLD and palpitations.   At last visit with Dr. Johney Frame on 09/01/20, patient reported heart fluttering especially after eating fried food late at night. His flutters resolved after cutting out those foods. His home blood pressures at that appointment were not controlled at 180/110 mmHg. Amlodipine was increased to 10 mg daily and HCTZ 25 mg daily was initiated. Metoprolol succinate was held since patient was no longer having palpitations.   Patient presents today in good spirits. Patient denies palpitations and has continued to avoid fried foods. Patient reports having a bad headache the first day he started HCTZ but he has been tolerating medication changes well otherwise. Patient reports lowest home reading was 138/90 mmHg. Patient takes his blood pressure in the afternoon or evening when he remembers. He takes BP medications in the morning. BP in clinic today was 144/100 mmHg about an hour after BP medications. Patient reports he does not typically exercise and has not made changes in his diet. Patient is motivated to start exercising and making dietary changes to reduce amount of medications needed to lower BP to goal. Patient is uninsured and uses GoodRx at [facility].   Current HTN meds: amlodipine 10 mg daily, HCTZ 25 mg daily Previously tried: amlodipine-olmesartan 10-40 mg daily (regimen change due to palpitations), metoprolol succinate 25 mg daily (no longer having palpitations) BP goal: <130/80  Family History: maternal GM: HTN, MI with CAD (in 57s) maternal GF: MI with CAD (age 56); Mother: Afib; Father: healthy.  Social History: Patient is never smoker. Patient denies drug use.   Diet:  Breakfast: 2 eggs, pancake, sometimes gets hash browns -- makes at home or picks  up Lunch: chicken sandwich, cheeseburger on occasion Dinner: chicken in air fryer or crock pot, green beans frozen or canned, mashed potatoes or mac and cheese  Eats chicken, fish, or sometimes beef Drinks: likes lemonade and ginger ale, drinks water   Exercise: no routine currently.  Home BP readings: 180/110 reported at visit with Dr. Johney Frame  138/90 yesterday later in the evening around 6 or 7 -- checks when he thinks about it. Has gone down   Wt Readings from Last 3 Encounters:  09/01/20 218 lb (98.9 kg)  06/10/20 216 lb (98 kg)  06/28/19 203 lb (92.1 kg)   BP Readings from Last 3 Encounters:  09/07/20 (!) 144/100  09/01/20 (!) 180/110  06/10/20 (!) 174/108   Pulse Readings from Last 3 Encounters:  09/07/20 87  09/01/20 74  06/10/20 72    Renal function: CrCl cannot be calculated (Patient's most recent lab result is older than the maximum 21 days allowed.).  Past Medical History:  Diagnosis Date  . Eczema   . Hyperlipidemia   . Hypertension     Current Outpatient Medications on File Prior to Visit  Medication Sig Dispense Refill  . amLODipine (NORVASC) 10 MG tablet Take 1 tablet (10 mg total) by mouth daily. 90 tablet 1  . atorvastatin (LIPITOR) 20 MG tablet Take 1 tablet (20 mg total) by mouth daily. 90 tablet 1  . hydrochlorothiazide (HYDRODIURIL) 25 MG tablet Take 1 tablet (  25 mg total) by mouth daily. 90 tablet 1   No current facility-administered medications on file prior to visit.    No Known Allergies  Blood pressure (!) 144/100, pulse 87.   Assessment/Plan:  1. Hypertension - Patient presents with BP above goal of <130/80. Patient recently initiated on HCTZ 25 mg daily and amlodipine was increased to 10 mg daily. Recommended adding irbesartan 12m daily however, patient desired to see maximum effect of recent medication changes and make lifestyle modifications before adding any medications. Considering recent (1 week ago) change in BP medications and  patient desires to not make additional changes today, will continue current regimen of amlodipine 10 mg daily and HCTZ 25 mg daily. Will consider adding irbesartan 75 mg daily or lisinopril 5 mg daily if BP remains elevated at future visits. Discussed benefits of lowering BP to goal including reducing risk of stroke and MI. Discussed dietary modifications including limiting cheese, rinsing canned vegetables and reading food labels for sodium. Discussed starting an exercise regimen to help lower BP. Reviewed BP measurement technique.  BMET today considering recent HCTZ initiation. Will see patient in 2 weeks and consider adding ACEi/ARB pending BP. Encouraged patient to bring home BP monitor for calibration.    Thank you,   MBenna Dunks PharmD Candidate, Class of 2022  MRamond Dial Pharm.D, BCPS, CPP [place] [address] [place] [address] Phone: [phone] Fax: [phone]

## 2020-09-07 NOTE — Patient Instructions (Addendum)
It was a pleasure seeing you today!   Continue taking amlodipine 10 mg daily and hydrochlorothiazide 25 mg daily.   Try to limit sodium in your diet by limiting cheese in eggs and on hamburgers. Rinse canned vegetables well to limit sodium intake. Try to exercise 30 minutes for 5 days per week or any way you can get as close to 150 minutes per week as possible.   Continue to take your blood pressure at least 2 hours after taking your morning medications.   We will see you in 2 weeks to look at your blood pressure. Please bring your blood pressure cuff to this visit so we can make sure it is accurate.   Hypertension "High blood pressure"  Hypertension is often called "The Silent Killer." It rarely causes symptoms until it is extremely  high or has done damage to other organs in the body. For this reason, you should have your  blood pressure checked regularly by your physician. We will check your blood pressure  every time you see a provider at one of our offices.   Your blood pressure reading consists of two numbers. Ideally, blood pressure should be  below 120/80. The first ("top") number is called the systolic pressure. It measures the  pressure in your arteries as your heart beats. The second ("bottom") number is called the diastolic pressure. It measures the pressure in your arteries as the heart relaxes between beats.  The benefits of getting your blood pressure under control are enormous. A 10-point  reduction in systolic blood pressure can reduce your risk of stroke by 27% and heart failure by 28%  Your blood pressure goal is <130/80  To check your pressure at home you will need to:  1. Sit up in a chair, with feet flat on the floor and back supported. Do not cross your ankles or legs. 2. Rest your left arm so that the cuff is about heart level. If the cuff goes on your upper arm,  then just relax the arm on the table, arm of the chair or your lap. If you have a wrist cuff, we   suggest relaxing your wrist against your chest (think of it as Pledging the Flag with the  wrong arm).  3. Place the cuff snugly around your arm, about 1 inch above the crook of your elbow. The  cords should be inside the groove of your elbow.  4. Sit quietly, with the cuff in place, for about 5 minutes. After that 5 minutes press the power  button to start a reading. 5. Do not talk or move while the reading is taking place.  6. Record your readings on a sheet of paper. Although most cuffs have a memory, it is often  easier to see a pattern developing when the numbers are all in front of you.  7. You can repeat the reading after 1-3 minutes if it is recommended  Make sure your bladder is empty and you have not had caffeine or tobacco within the last 30 min  Always bring your blood pressure log with you to your appointments. If you have not brought your monitor in to be double checked for accuracy, please bring it to your next appointment.  You can find a list of validated (accurate) blood pressure cuffs at [email or web address].novalidatebp.org  Healthy Diet  SALT  What is the big deal with sodium? Why the need to limit our intake? What is the connection to  blood pressure? And what is  the difference between salt and sodium? Sodium attracts water. Think about it. When you eat an overly salty snack, you tend to become  thirsty and need more water. If you have too much sodium in your bloodstream, your body will  then pull water into the bloodstream as well, trying to correct the imbalance. When you have  more volume in the bloodstream, your blood pressure goes up. Your heart must work harder to  pump the extra volume, and the increase in pressure can wear out blood vessels faster. You may  also notice bloating and weight gain. Hypertension is one of the leading risk factors for heart  disease. By limiting sodium intake throughout life you are helping decrease your risk of heart  disease later on.   Salt is  made up of two minerals. Sodium and chloride. A teaspoon of salt contains about 40%  sodium and 60% chloride. One teaspoon of salt has 2,300 mg of sodium. While the current  USDA guideline states you should consume no more than 2,300 mg per day, both they and the  American Heart Association recommend that you limit this to 1,500 mg to stay healthy. Sea salt  or Himalayan pink salt may have a slightly different taste, but they still have almost the same  percent of sodium per teaspoon. So feel free to use them instead of table salt, but don't use  more.  A common myth is that if you don't add salt to your food, you are following a low sodium diet.  However, 75% of the sodium consumed in the American diet is from processed foods, NOT the  salt shaker. We all know that chips and crackers are high in sodium, but there are many other  foods that we may not think of when limiting our sodium. Below are the "salty six" foods that  the American Heart Association wants you to be aware of. 1. Cold cuts - even the healthy sliced [place] can have over 1,000 mg of sodium per slice.   Compare different brands to see which has less sodium if you eat these regularly 2. Pizza - depending on your toppings, a slice of pizza can have up to 760 mg of   sodium. Put more veggies on it or just have a slice with a side salad and still enjoy. 3. Soup - yes even that old home remedy of chicken soup is loaded with sodium. Look   for low sodium versions. Or add a bunch of frozen veggies when heating it, this will   give you less sodium per serving 4. Breads - they may not taste salty, but a single slice of bread can have up to 230 mg of   sodium. Toast for breakfast, a sandwich at lunch and a dinner roll can quickly add up   to over 900 mg in just one day.  5. Chicken - some fresh or frozen chicken is injected with a sodium solution before it   reaches the store. A 4 oz serving should have no more than 100 mg sodium. And    watch for breaded frozen chicken nuggets, strips and tenders. They may seem like a   quick and easy "healthy" meal, but they have high amounts of sodium as well. 6. Burritos/tacos - just 2 teaspoons of taco seasoning can have over 400 mg sodium.   Try making your own with equal parts of cumin, oregano, chili powder and garlic powder.   SUGAR  Sugar is a huge  problem in the modern day diet. Sugar is a HUGE contributor to heart disease, diabetes, high triglyceride levels, fatty liver diease and obesity. Sugar is hidden in almost all packaged foods/beverages. It adds no nutritional benefit to your body and can cause major harm. Added sugar is extra sugar that is added beyond what is naturally found. The American Heart Association recommends limiting added sugars to no more than 25g for women and 36 grams for men per day.  There are many names for sugar maltose, sucrose (names ending in "ose"), high fructose corn syrup, molasses, cane sugar, corn sweetener, raw sugar, syrup, honey or fruit juice concentrate.   One of the best ways to limit your added sugars is to stop drinking sweetened beverages such as soda, sweet tea, fruit juice or fancy coffee's. There is 65g of added sugars in one 20oz bottle of Coke!! That is equal to 6 donuts.   Pay attention and read all nutrition facts labels. Below is an examples of a nutrition facts label. The #1 is showing you the total sugars where the # 2 is showing you the added sugars. This one severing has almost the max amount of added sugars per day!  Watch out for items that say "low fat" or "no added sugar" as these products are typically very high in sugar. The food industry uses these terms to fool you into thinking they are healthy.  For more information on the dangers of sugar watch WHY Sugar is as Bad as Alcohol (Fructose, The Liver Toxin) on YouTube.    EXERCISE  Exercise can help lower your blood pressure ~5 points systolic (top #) and 8 points  diastolic (bottom #)  Exercise is good. We've all heard that. In an ideal world, we would all have time and resources to  get plenty of it. When you are active your heart pumps more efficiently and you will feel better.  Multiple studies show that even walking regularly has benefits that include living a longer life.  The American Heart Association recommends 90-150 minutes per week of exercise (30 minutes  per day most days of the week). You can do this in any increment you wish. Nine or more  10-minute walks count. So does an hour-long exercise class. Break the time apart into what will  work in your life. Some of the best things you can do include walking briskly, jogging, cycling or  swimming laps. Not everyone is ready to "exercise." Sometimes we need to start with just getting active. Here  are some easy ways to be more active throughout the day: Marland Kitchen Take the stairs instead of the elevator . Go for a 10-15 minute walk during your lunch break (find a friend to make it more enjoyable) . When shopping, park at the back of the parking lot . If you take public transportation, get off one stop early and walk the extra distance . Pace around while making phone calls (most of [place] are not attached to phone cords any longer!) Check with your doctor if you aren't sure what your limitations may be. Always remember to drink plenty of water when doing any type of exercise. Don't feel like a failure if you're not getting the 90-150 minutes per week. If you started by being  a couch potato, then just a 10-minute walk each day is a huge improvement. Start with little  victories and work your way up.   Healthy Eating Tips  When looking to improve your eating habits, whether  to lose weight, lower blood pressure or just be healthier, it helps to know what a serving size is.   Grains 1 slice of bread,  bagel,  cup pasta or rice  Vegetables 1 cup fresh or raw vegetables,  cup cooked or  canned Fruits 1 piece of medium sized fruit,  cup canned,   Meats/Proteins  cup dried       1 oz meat, 1 egg,  cup cooked beans, nuts or seeds  Dairy        Fats Individual yogurt container, 1 cup (8oz)    1 teaspoon margarine/butter or vegetable  milk or milk alternative, 1 slice of cheese          oil; 1 tablespoon mayonnaise or salad dressing                  Plan ahead: make a menu of the meals for a week then create a grocery list to go with  that menu. Consider meals that easily stretch into a night of leftovers, such as stews or  casseroles. Or consider making two of your favorite meal and put one in the freezer for  another night. Try a night or two each week that is "meatless" or "no cook" such as salads.  When you get home from the grocery store wash and prepare your vegetables and fruits.  Then when you need them they are ready to go.  Tips for going to the grocery store: . Buy store or generic brands . Check the weekly ad from your store on-line or in their in-store flyer . Look at the unit price on the shelf tag to compare/contrast the costs of different items . Buy fruits/vegetables in season . Carrots, bananas and apples are low-cost, naturally healthy items . If meats or frozen vegetables are on sale, buy some extras and put in your freezer . Limit buying prepared or "ready to eat" items, even if they are pre-made salads or fruit snacks . Do not shop when you're hungry . Foods at eye level tend to be more expensive. Look on the high and low shelves for deals. . Consider shopping at the farmer's market for fresh foods in season. . Choose canned tuna or salmon instead of fresh . Avoid the cookie and chip aisles (these are expensive, high in calories and low in  nutritional value) Healthy food preparations: . If you can't get lean hamburger, be sure to drain the fat when cooking . Steam, saut (in olive oil), grill or bake foods . Experiment with different seasonings  to avoid adding salt to your foods. Kosher salt, sea salt and Himalayan salt are all still salt and should be avoided         Resources: American Heart Association - MartiniMobile.it Go to the Healthy Living tab to get more information American Diabetes Association - www.diabetes.org You don't have to be diabetic - check out the Food and Fitness tab  DASH diet - PopSteam.is Health topics - or just search on their home page for DASH Quit for Life - www.cancer.org Follow the Stay Healthy tab to learn more about smoking cessatio

## 2020-09-08 ENCOUNTER — Telehealth: Payer: Self-pay

## 2020-09-08 NOTE — Telephone Encounter (Signed)
Called patient to inform them that their labs came back good. Lytes WNL and renal function stable. Patient verbalized understanding and will continue amlodipine 10 mg daily and HCTZ 25 mg daily.

## 2020-09-22 ENCOUNTER — Telehealth: Payer: Self-pay | Admitting: Pharmacist

## 2020-09-22 ENCOUNTER — Ambulatory Visit: Payer: Self-pay

## 2020-09-22 NOTE — Progress Notes (Deleted)
Patient ID: Chris Shah                 DOB: 07-22-1976                      MRN: [id number]     HPI: Chris Shah is a 44 y.o. male referred by Dr. Johney Frame to HTN clinic. PMH is significant for HTN, HLD and palpitations.   At last visit with HTN clinic on 09/07/20 patient's blood pressure was 144/100. He had only been on amlodipine 45m (increased dose) and HCTZ 259mfor about a week. He desired more time to work on lifestyle. Therefore no medication changes were made.   Compliance? Exercise? Fried foods? Palpitations? Dizziness, lightheadedness, headache, blurred vision, SOB, swelling Focus on reducing carbs- interested in any programs on diet?   Patient is uninsured and [facility]  Current HTN meds: amlodipine 10 mg daily, HCTZ 25 mg daily Previously tried: amlodipine-olmesartan 10-40 mg daily (regimen change due to palpitations), metoprolol succinate 25 mg daily (no longer having palpitations) BP goal: <130/80  Family History: maternal GM: HTN, MI with CAD (in 5078smaternal GF: MI with CAD (age 43 Mother: Afib; Father: healthy.  Social History: Patient is never smoker. Patient denies drug use.   Diet:  Breakfast: 2 eggs, pancake, sometimes gets hash browns -- makes at home or picks up Lunch: chicken sandwich, cheeseburger on occasion Dinner: chicken in air fryer or crock pot, green beans frozen or canned, mashed potatoes or mac and cheese  Eats chicken, fish, or sometimes beef Drinks: likes lemonade and ginger ale, drinks water   Exercise: no routine currently.  Home BP readings: 180/110 reported at visit with Dr. PeJohney Frame138/90 yesterday later in the evening around 6 or 7 -- checks when he thinks about it. Has gone down   Wt Readings from Last 3 Encounters:  09/01/20 218 lb (98.9 kg)  06/10/20 216 lb (98 kg)  06/28/19 203 lb (92.1 kg)   BP Readings from Last 3 Encounters:  09/07/20 (!) 144/100  09/01/20 (!) 180/110  06/10/20 (!)  174/108   Pulse Readings from Last 3 Encounters:  09/07/20 87  09/01/20 74  06/10/20 72    Renal function: CrCl cannot be calculated (Unknown ideal weight.).  Past Medical History:  Diagnosis Date  . Eczema   . Hyperlipidemia   . Hypertension     Current Outpatient Medications on File Prior to Visit  Medication Sig Dispense Refill  . amLODipine (NORVASC) 10 MG tablet Take 1 tablet (10 mg total) by mouth daily. 90 tablet 1  . atorvastatin (LIPITOR) 20 MG tablet Take 1 tablet (20 mg total) by mouth daily. 90 tablet 1  . hydrochlorothiazide (HYDRODIURIL) 25 MG tablet Take 1 tablet (25 mg total) by mouth daily. 90 tablet 1   No current facility-administered medications on file prior to visit.    No Known Allergies  There were no vitals taken for this visit.   Assessment/Plan:  1. Hypertension - Patient presents with BP above goal of <130/80. Patient recently initiated on HCTZ 25 mg daily and amlodipine was increased to 10 mg daily. Recommended adding irbesartan 7557maily however, patient desired to see maximum effect of recent medication changes and make lifestyle modifications before adding any medications. Considering recent (1 week ago) change in BP medications and patient desires to not make additional changes today, will continue current regimen of amlodipine 10 mg daily and HCTZ 25 mg daily. Will  consider adding irbesartan 75 mg daily or lisinopril 5 mg daily if BP remains elevated at future visits. Discussed benefits of lowering BP to goal including reducing risk of stroke and MI. Discussed dietary modifications including limiting cheese, rinsing canned vegetables and reading food labels for sodium. Discussed starting an exercise regimen to help lower BP. Reviewed BP measurement technique.  BMET today considering recent HCTZ initiation. Will see patient in 2 weeks and consider adding ACEi/ARB pending BP. Encouraged patient to bring home BP monitor for calibration.    Thank  you,    Ramond Dial, Pharm.D, BCPS, CPP [place]  [address] [place], [place] [address]  Phone: [phone]; Fax: [phone]

## 2020-09-22 NOTE — Telephone Encounter (Signed)
Patient missed his HTN clinic apt this AM. I called pt and LVM for him to call back to reschedule.

## 2020-09-24 NOTE — Telephone Encounter (Signed)
Called patient and rescheduled him for 12/30 @ 2:00

## 2020-10-08 ENCOUNTER — Ambulatory Visit: Payer: Self-pay

## 2020-10-08 NOTE — Progress Notes (Deleted)
Patient ID: Chris Shah                 DOB: 1976-08-19                      MRN: [id number]     HPI: Chris Shah is a 44 y.o. male referred by Dr. Johney Frame to HTN clinic. PMH is significant for HTN, HLD and palpitations.   At last visit with HTN clinic on 09/07/20 patient's blood pressure was 144/100. He had only been on amlodipine 49m (increased dose) and HCTZ 274mfor about a week. He desired more time to work on lifestyle. Therefore no medication changes were made.   Compliance? Exercise? Fried foods? Palpitations? Dizziness, lightheadedness, headache, blurred vision, SOB, swelling Focus on reducing carbs- interested in any programs on diet?   Patient is uninsured and uses GoodRx at [facility] [employer]  Current HTN meds: amlodipine 10 mg daily, HCTZ 25 mg daily Previously tried: amlodipine-olmesartan 10-40 mg daily (regimen change due to palpitations), metoprolol succinate 25 mg daily (no longer having palpitations) BP goal: <130/80  Family History: maternal GM: HTN, MI with CAD (in 5065smaternal GF: MI with CAD (age 43 Mother: Afib; Father: healthy.  Social History: Patient is never smoker. Patient denies drug use.   Diet:  Breakfast: 2 eggs, pancake, sometimes gets hash browns -- makes at home or picks up Lunch: chicken sandwich, cheeseburger on occasion Dinner: chicken in air fryer or crock pot, green beans frozen or canned, mashed potatoes or mac and cheese  Eats chicken, fish, or sometimes beef Drinks: likes lemonade and ginger ale, drinks water   Exercise: no routine currently.  Home BP readings: 180/110 reported at visit with Dr. PeJohney Frame138/90 yesterday later in the evening around 6 or 7 -- checks when he thinks about it. Has gone down   Wt Readings from Last 3 Encounters:  09/01/20 218 lb (98.9 kg)  06/10/20 216 lb (98 kg)  06/28/19 203 lb (92.1 kg)   BP Readings from Last 3 Encounters:  09/07/20 (!) 144/100  09/01/20 (!) 180/110  06/10/20 (!)  174/108   Pulse Readings from Last 3 Encounters:  09/07/20 87  09/01/20 74  06/10/20 72    Renal function: CrCl cannot be calculated (Patient's most recent lab result is older than the maximum 21 days allowed.).  Past Medical History:  Diagnosis Date  . Eczema   . Hyperlipidemia   . Hypertension     Current Outpatient Medications on File Prior to Visit  Medication Sig Dispense Refill  . amLODipine (NORVASC) 10 MG tablet Take 1 tablet (10 mg total) by mouth daily. 90 tablet 1  . atorvastatin (LIPITOR) 20 MG tablet Take 1 tablet (20 mg total) by mouth daily. 90 tablet 1  . hydrochlorothiazide (HYDRODIURIL) 25 MG tablet Take 1 tablet (25 mg total) by mouth daily. 90 tablet 1   No current facility-administered medications on file prior to visit.    No Known Allergies  There were no vitals taken for this visit.   Assessment/Plan:  1. Hypertension - Patient presents with BP above goal of <130/80. Patient recently initiated on HCTZ 25 mg daily and amlodipine was increased to 10 mg daily. Recommended adding irbesartan 7581maily however, patient desired to see maximum effect of recent medication changes and make lifestyle modifications before adding any medications. Considering recent (1 week ago) change in BP medications and patient desires to not make additional changes today, will continue current regimen of  amlodipine 10 mg daily and HCTZ 25 mg daily. Will consider adding irbesartan 75 mg daily or lisinopril 5 mg daily if BP remains elevated at future visits. Discussed benefits of lowering BP to goal including reducing risk of stroke and MI. Discussed dietary modifications including limiting cheese, rinsing canned vegetables and reading food labels for sodium. Discussed starting an exercise regimen to help lower BP. Reviewed BP measurement technique.  BMET today considering recent HCTZ initiation. Will see patient in 2 weeks and consider adding ACEi/ARB pending BP. Encouraged patient  to bring home BP monitor for calibration.    Thank you,    Ramond Dial, Pharm.D, BCPS, CPP [place]  [address] [place], [place] [address]  Phone: [phone]; Fax: [phone]

## 2020-11-27 NOTE — Progress Notes (Deleted)
Cardiology Office Note:    Date:  11/27/2020   ID:  Chris Shah, DOB Aug 14, 1976, MRN [id number]  PCP:  Salley Scarlet, MD   [facility]  Cardiologist:  No primary care provider on file. *** Advanced Practice Provider:  No care team member to display Electrophysiologist:  None  {Press F2 to show EP APP, CHF, sleep or structural heart MD               :[id number]}  { Click here to update then REFRESH NOTE - MD (PCP) or APP (Team Member)  Change PCP Type for MD, Specialty for APP is either Cardiology or Clinical Cardiac Electrophysiology  :[id number]}   Referring MD: Salley Scarlet, MD   No chief complaint on file. ***  History of Present Illness:    Chris Shah is a 44 y.o. male with a hx of HTN and HLD who presents to clinic for follow-up of palpitations.  Last seen on 09/01/21 where he was experiencing heart fluttering especially at night. Thought symptoms were related to reflux with fried foods. He cut those foods out and his symptoms resolved. Notably his blood pressure was very elevated at that visit and he has since followed up with HTN clinic. Was continued on amlodipine and HCTZ at that time, but patient did not follow-up.  Past Medical History:  Diagnosis Date  . Eczema   . Hyperlipidemia   . Hypertension     Past Surgical History:  Procedure Laterality Date  . VASECTOMY      Current Medications: No outpatient medications have been marked as taking for the 11/30/20 encounter (Appointment) with Meriam Sprague, MD.     Allergies:   Patient has no known allergies.   Social History   Socioeconomic History  . Marital status: Married    Spouse name: KIMBERLY  . Number of children: 3  . Years of education: 62  . Highest education level: High school graduate  Occupational History  . Occupation: Hospital doctor  Tobacco Use  . Smoking status: Never Smoker  . Smokeless tobacco: Never Used  Vaping Use  . Vaping Use: Never used   Substance and Sexual Activity  . Alcohol use: Yes    Comment: occasionally  . Drug use: Never  . Sexual activity: Yes    Birth control/protection: Surgical  Other Topics Concern  . Not on file  Social History Narrative  . Not on file   Social Determinants of Health   Financial Resource Strain: Not on file  Food Insecurity: Not on file  Transportation Needs: Not on file  Physical Activity: Not on file  Stress: Not on file  Social Connections: Not on file     Family History: The patient's ***family history includes Arthritis in his father; Atrial fibrillation in his mother; Diabetes in his maternal grandmother; Diverticulitis in his mother and sister; Heart disease in his maternal grandfather and maternal grandmother; Hypertension in his mother.  ROS:   Please see the history of present illness.    *** All other systems reviewed and are negative.  EKGs/Labs/Other Studies Reviewed:    The following studies were reviewed today: CT Abdomen/pelvis 2019: Lower Chest: No acute findings.  Hepatobiliary: No hepatic masses identified. Gallbladder is unremarkable.  Pancreas: No mass or inflammatory changes.  Spleen: Within normal limits in size and appearance.  Adrenals/Urinary Tract: No masses identified. No evidence of hydronephrosis. Unremarkable unopacified urinary bladder.  Stomach/Bowel: No evidence of obstruction, inflammatory process or abnormal fluid collections. Normal  appendix visualized.  Vascular/Lymphatic: No pathologically enlarged lymph nodes. No abdominal aortic aneurysm.  Reproductive: No mass or other significant abnormality.  Other: None.  Musculoskeletal: No suspicious bone lesions identified.  IMPRESSION: Negative. No acute findings or other significant abnormality   EKG:  EKG is *** ordered today.  The ekg ordered today demonstrates ***  Recent Labs: 06/10/2020: ALT 33; Hemoglobin 14.3; Platelets 285; TSH 1.28 09/07/2020: BUN  18; Creatinine, Ser 1.05; Potassium 4.0; Sodium 139  Recent Lipid Panel    Component Value Date/Time   CHOL 222 (H) 06/10/2020 1155   TRIG 110 06/10/2020 1155   HDL 48 06/10/2020 1155   CHOLHDL 4.6 06/10/2020 1155   LDLCALC 151 (H) 06/10/2020 1155     Risk Assessment/Calculations:   {Does this patient have ATRIAL FIBRILLATION?[phone]}   Physical Exam:    VS:  There were no vitals taken for this visit.    Wt Readings from Last 3 Encounters:  09/01/20 218 lb (98.9 kg)  06/10/20 216 lb (98 kg)  06/28/19 203 lb (92.1 kg)     GEN: *** Well nourished, well developed in no acute distress HEENT: Normal NECK: No JVD; No carotid bruits LYMPHATICS: No lymphadenopathy CARDIAC: ***RRR, no murmurs, rubs, gallops RESPIRATORY:  Clear to auscultation without rales, wheezing or rhonchi  ABDOMEN: Soft, non-tender, non-distended MUSCULOSKELETAL:  No edema; No deformity  SKIN: Warm and dry NEUROLOGIC:  Alert and oriented x 3 PSYCHIATRIC:  Normal affect   ASSESSMENT:    No diagnosis found. PLAN:    In order of problems listed above:  #Heart palpitations: Resolved with changes in his diet. No further episodes for the last 2-3 weeks.  -Will continue to monitor -He wants to trial off beta blocker due to side-effects and monitor for palpitations -If recur, will plan on Zio and can re-start BB at this time -Agree with dietary changes (cutting out fried foods and avoiding late night meals) as this helps prevent reflux as well as promotes CV health  #Hypertension: Poorly controlled. Very elevated today at 180/110. -Increase amlodipine to 10mg  daily -Continue HCTZ 25mg  daily -Hold BB per patient's preference; if palpitations recur--can resume (coreg if BP elevated at that time) -Consider ACE/ARB pending response to blood pressure medications as detailed above -Follow-up in HTN clinic  #HLD: ASCVD risk 10.8% with TC 232, HDL 56, LDL 156, TG 91.  -Continue atorvastatin to 20mg   daily -Spoke extensively about diet and exercise as detailed below  Exercise recommendations: Goal of exercising for at least 30 minutes a day, at least 5 times per week.  Please exercise to a moderate exertion.  This means that while exercising it is difficult to speak in full sentences, however you are not so short of breath that you feel you must stop, and not so comfortable that you can carry on a full conversation.  Exertion level should be approximately a 5/10, if 10 is the most exertion you can perform.  Diet recommendations: Recommend a heart healthy diet such as the Mediterranean diet.  This diet consists of plant based foods, healthy fats, lean meats, olive oil.  It suggests limiting the intake of simple carbohydrates such as white breads, pastries, and pastas.  It also limits the amount of red meat, wine, and dairy products such as cheese that one should consume on a daily basis.   {Are you ordering a CV Procedure (e.g. stress test, cath, DCCV, TEE, etc)?   Press F2        :    Medication  Adjustments/Labs and Tests Ordered: Current medicines are reviewed at length with the patient today.  Concerns regarding medicines are outlined above.  No orders of the defined types were placed in this encounter.  No orders of the defined types were placed in this encounter.   There are no Patient Instructions on file for this visit.   Signed, Meriam Sprague, MD  11/27/2020 1:02 PM    [facility]

## 2020-11-30 ENCOUNTER — Ambulatory Visit: Payer: Self-pay | Admitting: Cardiology

## 2021-05-02 ENCOUNTER — Other Ambulatory Visit: Payer: Self-pay

## 2021-05-02 ENCOUNTER — Ambulatory Visit
Admission: EM | Admit: 2021-05-02 | Discharge: 2021-05-02 | Disposition: A | Discharge status: Home / Self Care | Payer: Self-pay | Attending: Physician Assistant | Admitting: Physician Assistant

## 2021-05-02 ENCOUNTER — Encounter: Payer: Self-pay | Admitting: Emergency Medicine

## 2021-05-02 DIAGNOSIS — Z79899 Other long term (current) drug therapy: Secondary | ICD-10-CM

## 2021-05-02 DIAGNOSIS — E782 Mixed hyperlipidemia: Secondary | ICD-10-CM

## 2021-05-02 DIAGNOSIS — I10 Essential (primary) hypertension: Secondary | ICD-10-CM

## 2021-05-02 MED ORDER — HYDROCHLOROTHIAZIDE 25 MG PO TABS: 25.0000 mg | ORAL_TABLET | Freq: Every day | ORAL | 1 refills | Status: DC

## 2021-05-02 MED ORDER — AMLODIPINE BESYLATE 10 MG PO TABS: 10.0000 mg | ORAL_TABLET | Freq: Every day | ORAL | 1 refills | Status: DC

## 2021-05-02 NOTE — ED Provider Notes (Signed)
[facility]    CSN: [id number] Arrival date & time: 05/02/21  1251      History   Chief Complaint No chief complaint on file.   HPI Chris Shah is a 45 y.o. male.   Pt complains of high blood pressure.  Pt is out of medications.  Pt is in between doctors   The history is provided by the patient. No language interpreter was used.  Hypertension Pertinent negatives include no headaches. Nothing aggravates the symptoms. Nothing relieves the symptoms. He has tried nothing for the symptoms.   Past Medical History:  Diagnosis Date   Eczema    Hyperlipidemia    Hypertension     Patient Active Problem List   Diagnosis Date Noted   Hypertension 02/13/2018   Hyperlipidemia 02/13/2018   Class 1 obesity 02/13/2018   Left hip pain 02/13/2018    Past Surgical History:  Procedure Laterality Date   VASECTOMY         Home Medications    Prior to Admission medications   Medication Sig Start Date End Date Taking? Authorizing Provider  amLODipine (NORVASC) 10 MG tablet Take 1 tablet (10 mg total) by mouth daily. 05/02/21   Elson Areas, PA-C  atorvastatin (LIPITOR) 20 MG tablet Take 1 tablet (20 mg total) by mouth daily. 09/01/20   Meriam Sprague, MD  hydrochlorothiazide (HYDRODIURIL) 25 MG tablet Take 1 tablet (25 mg total) by mouth daily. 05/02/21   Elson Areas, PA-C    Family History Family History  Problem Relation Age of Onset   Hypertension Mother    Diverticulitis Mother    Atrial fibrillation Mother    Arthritis Father    Diverticulitis Sister    Heart disease Maternal Grandmother    Diabetes Maternal Grandmother    Heart disease Maternal Grandfather     Social History Social History   Tobacco Use   Smoking status: Never   Smokeless tobacco: Never  Vaping Use   Vaping Use: Never used  Substance Use Topics   Alcohol use: Yes    Comment: occasionally   Drug use: Never     Allergies   Patient has no known  allergies.   Review of Systems Review of Systems  Neurological:  Negative for headaches.  All other systems reviewed and are negative.   Physical Exam Triage Vital Signs ED Triage Vitals  Enc Vitals Group     BP 05/02/21 1308 (!) 184/105     Pulse Rate 05/02/21 1308 68     Resp 05/02/21 1308 16     Temp 05/02/21 1308 98 F (36.7 C)     Temp Source 05/02/21 1308 Oral     SpO2 05/02/21 1308 97 %     Weight --      Height --      Head Circumference --      Peak Flow --      Pain Score 05/02/21 1309 0     Pain Loc --      Pain Edu? --      Excl. in GC? --    No data found.  Updated Vital Signs BP (!) 184/105 (BP Location: Right Arm)   Pulse 68   Temp 98 F (36.7 C) (Oral)   Resp 16   SpO2 97%   Visual Acuity Right Eye Distance:   Left Eye Distance:   Bilateral Distance:    Right Eye Near:   Left Eye Near:    Bilateral Near:  Physical Exam Vitals and nursing note reviewed.  Constitutional:      Appearance: He is well-developed.  HENT:     Head: Normocephalic and atraumatic.  Eyes:     Conjunctiva/sclera: Conjunctivae normal.  Cardiovascular:     Rate and Rhythm: Normal rate and regular rhythm.     Heart sounds: No murmur heard. Pulmonary:     Effort: Pulmonary effort is normal. No respiratory distress.     Breath sounds: Normal breath sounds.  Abdominal:     Palpations: Abdomen is soft.     Tenderness: There is no abdominal tenderness.  Musculoskeletal:        General: Normal range of motion.     Cervical back: Neck supple.  Skin:    General: Skin is warm and dry.  Neurological:     Mental Status: He is alert.  Psychiatric:        Mood and Affect: Mood normal.     UC Treatments / Results  Labs (all labs ordered are listed, but only abnormal results are displayed) Labs Reviewed - No data to display  EKG   Radiology No results found.  Procedures Procedures (including critical care time)  Medications Ordered in UC Medications - No  data to display  Initial Impression / Assessment and Plan / UC Course  I have reviewed the triage vital signs and the nursing notes.  Pertinent labs & imaging results that were available during my care of the patient were reviewed by me and considered in my medical decision making (see chart for details).     MDM:  Pt given rx for amlodipine and hctz.  Final Clinical Impressions(s) / UC Diagnoses   Final diagnoses:  Mixed hyperlipidemia  Primary hypertension  Medication management     Discharge Instructions      Return if any problems.   ED Prescriptions     Medication Sig Dispense Auth. Provider   amLODipine (NORVASC) 10 MG tablet Take 1 tablet (10 mg total) by mouth daily. 90 tablet Derren Suydam K, [place]   hydrochlorothiazide (HYDRODIURIL) 25 MG tablet Take 1 tablet (25 mg total) by mouth daily. 90 tablet Elson Areas, [place]      PDMP not reviewed this encounter. An After Visit Summary was printed and given to the patient.    Elson Areas, [place] 05/02/21 1336

## 2021-05-02 NOTE — Discharge Instructions (Addendum)
Return if any problems.

## 2021-05-02 NOTE — ED Triage Notes (Signed)
Needs refill for BP medication amlodipine 10mg  andHCTZ 25mg 

## 2022-04-23 ENCOUNTER — Ambulatory Visit
Admission: RE | Admit: 2022-04-23 | Discharge: 2022-04-23 | Disposition: A | Discharge status: Home / Self Care | Payer: Self-pay | Source: Ambulatory Visit | Attending: Physician Assistant | Admitting: Physician Assistant

## 2022-04-23 VITALS — BP 165/108 | HR 65 | Temp 97.9°F | Resp 16

## 2022-04-23 DIAGNOSIS — I10 Essential (primary) hypertension: Secondary | ICD-10-CM

## 2022-04-23 DIAGNOSIS — Z79899 Other long term (current) drug therapy: Secondary | ICD-10-CM

## 2022-04-23 DIAGNOSIS — Z760 Encounter for issue of repeat prescription: Secondary | ICD-10-CM

## 2022-04-23 DIAGNOSIS — E782 Mixed hyperlipidemia: Secondary | ICD-10-CM

## 2022-04-23 MED ORDER — HYDROCHLOROTHIAZIDE 25 MG PO TABS: 25.0000 mg | ORAL_TABLET | Freq: Every day | ORAL | 0 refills | Status: AC

## 2022-04-23 MED ORDER — AMLODIPINE BESYLATE 10 MG PO TABS: 10.0000 mg | ORAL_TABLET | Freq: Every day | ORAL | 0 refills | Status: AC

## 2022-04-23 NOTE — ED Provider Notes (Signed)
[facility]    CSN: [id number] Arrival date & time: 04/23/22  3532      History   Chief Complaint Chief Complaint  Patient presents with   Hypertension    Entered by patient    HPI Chris Shah is a 46 y.o. male.   Patient presents today for refill of blood pressure medication.  He has a history of hypertension managed with amlodipine 10 mg and hydrochlorothiazide 25 mg daily.  Reports that he is between primary care providers and has been without his medication for several weeks.  He does try to limit the amount of salt in his diet as he is very sensitive to salt.  He does not exercise regularly.  He does monitor his blood pressure at home and reports that it is significantly improved when he is on his medication but does not normalize.  He is open to following with a new primary care provider is interested in help establishing with someone.  He is unsure when his last blood work was obtained but is open to this being done today.  He denies any chest pain, shortness of breath, headache, vision change, dizziness.    Past Medical History:  Diagnosis Date   Eczema    Hyperlipidemia    Hypertension     Patient Active Problem List   Diagnosis Date Noted   Hypertension 02/13/2018   Hyperlipidemia 02/13/2018   Class 1 obesity 02/13/2018   Left hip pain 02/13/2018    Past Surgical History:  Procedure Laterality Date   VASECTOMY         Home Medications    Prior to Admission medications   Medication Sig Start Date End Date Taking? Authorizing Provider  amLODipine (NORVASC) 10 MG tablet Take 1 tablet (10 mg total) by mouth daily. 04/23/22   Erendida Wrenn, Noberto Retort, PA-C  atorvastatin (LIPITOR) 20 MG tablet Take 1 tablet (20 mg total) by mouth daily. 09/01/20   Meriam Sprague, MD  hydrochlorothiazide (HYDRODIURIL) 25 MG tablet Take 1 tablet (25 mg total) by mouth daily. 04/23/22   Alizeh Madril, Noberto Retort, PA-C    Family History Family History  Problem Relation Age  of Onset   Hypertension Mother    Diverticulitis Mother    Atrial fibrillation Mother    Arthritis Father    Diverticulitis Sister    Heart disease Maternal Grandmother    Diabetes Maternal Grandmother    Heart disease Maternal Grandfather     Social History Social History   Tobacco Use   Smoking status: Never   Smokeless tobacco: Never  Vaping Use   Vaping Use: Never used  Substance Use Topics   Alcohol use: Yes    Comment: occasionally   Drug use: Never     Allergies   Patient has no known allergies.   Review of Systems Review of Systems  Constitutional:  Negative for activity change, appetite change, fatigue and fever.  Eyes:  Negative for visual disturbance.  Respiratory:  Negative for shortness of breath.   Cardiovascular:  Negative for chest pain and leg swelling.  Neurological:  Negative for dizziness, light-headedness and headaches.     Physical Exam Triage Vital Signs ED Triage Vitals  Enc Vitals Group     BP 04/23/22 1009 (!) 165/108     Pulse Rate 04/23/22 1009 65     Resp 04/23/22 1009 16     Temp 04/23/22 1009 97.9 F (36.6 C)     Temp Source 04/23/22 1009 Oral  SpO2 04/23/22 1009 98 %     Weight --      Height --      Head Circumference --      Peak Flow --      Pain Score 04/23/22 1012 0     Pain Loc --      Pain Edu? --      Excl. in GC? --    No data found.  Updated Vital Signs BP (!) 165/108 (BP Location: Right Arm)   Pulse 65   Temp 97.9 F (36.6 C) (Oral)   Resp 16   SpO2 98%   Visual Acuity Right Eye Distance:   Left Eye Distance:   Bilateral Distance:    Right Eye Near:   Left Eye Near:    Bilateral Near:     Physical Exam Vitals reviewed.  Constitutional:      General: He is awake.     Appearance: Normal appearance. He is well-developed. He is not ill-appearing.     Comments: Very pleasant male appears stated age in no acute distress sitting comfortably in exam room  HENT:     Head: Normocephalic and  atraumatic.     Mouth/Throat:     Pharynx: No oropharyngeal exudate, posterior oropharyngeal erythema or uvula swelling.  Cardiovascular:     Rate and Rhythm: Normal rate and regular rhythm.     Heart sounds: Normal heart sounds, S1 normal and S2 normal. No murmur heard. Pulmonary:     Effort: Pulmonary effort is normal.     Breath sounds: Normal breath sounds. No stridor. No wheezing, rhonchi or rales.     Comments: Clear to auscultation bilaterally Abdominal:     General: Bowel sounds are normal.     Palpations: Abdomen is soft.     Tenderness: There is no abdominal tenderness.     Comments: Benign abdominal exam  Musculoskeletal:     Right lower leg: No edema.     Left lower leg: No edema.  Neurological:     Mental Status: He is alert.  Psychiatric:        Behavior: Behavior is cooperative.      UC Treatments / Results  Labs (all labs ordered are listed, but only abnormal results are displayed) Labs Reviewed  BASIC METABOLIC PANEL    EKG   Radiology No results found.  Procedures Procedures (including critical care time)  Medications Ordered in UC Medications - No data to display  Initial Impression / Assessment and Plan / UC Course  I have reviewed the triage vital signs and the nursing notes.  Pertinent labs & imaging results that were available during my care of the patient were reviewed by me and considered in my medical decision making (see chart for details).     Blood pressure is elevated today.  Patient denies any signs/symptoms of endorgan damage.  We will refill hydrochlorothiazide and amlodipine as previously prescribed.  We will try to establish him with a primary care provider via PCP assistance.  BMP obtained today-results pending.  We will contact him if we need to change his treatment based on these lab results.  He is to monitor his diet for salt and exercise regularly.  Recommended he avoid NSAIDs, caffeine, decongestants.  If he develops any  chest pain, shortness of breath, headache, vision change, dizziness in the setting of high blood pressure he is to go to the emergency room.  Final Clinical Impressions(s) / UC Diagnoses   Final diagnoses:  Elevated  blood pressure reading with diagnosis of hypertension  Medication refill  Essential hypertension     Discharge Instructions      I have refilled your medications.  Please avoid sodium and exercise regularly.  Avoid decongestants, NSAIDs (aspirin, ibuprofen/Advil, naproxen/Aleve), and caffeine.  I will contact you if your blood work is abnormal we need to change your treatment plan.  Someone should reach out to you to schedule with primary care.  If you develop any chest pain, shortness of breath, headache, vision change, dizziness in the setting of high blood pressure you need to go to the emergency room immediately.     ED Prescriptions     Medication Sig Dispense Auth. Provider   amLODipine (NORVASC) 10 MG tablet Take 1 tablet (10 mg total) by mouth daily. 90 tablet Reegan Bouffard K, PA-C   hydrochlorothiazide (HYDRODIURIL) 25 MG tablet Take 1 tablet (25 mg total) by mouth daily. 90 tablet Taiyana Kissler, Derry Skill, PA-C      PDMP not reviewed this encounter.   Terrilee Croak, PA-C 04/23/22 1059

## 2022-04-23 NOTE — Discharge Instructions (Signed)
I have refilled your medications.  Please avoid sodium and exercise regularly.  Avoid decongestants, NSAIDs (aspirin, ibuprofen/Advil, naproxen/Aleve), and caffeine.  I will contact you if your blood work is abnormal we need to change your treatment plan.  Someone should reach out to you to schedule with primary care.  If you develop any chest pain, shortness of breath, headache, vision change, dizziness in the setting of high blood pressure you need to go to the emergency room immediately.

## 2022-04-23 NOTE — ED Triage Notes (Signed)
Pt presents for medication refill amlodipine 10 mg and hydrochlorothiazide 25 mg . Pt reports blood pressure was 150/111. Pt report he has not taken his meds in 2-3 weeks.

## 2022-04-24 LAB — BASIC METABOLIC PANEL
BUN/Creatinine Ratio: 10 (ref 9–20)
BUN: 10 mg/dL (ref 6–24)
CO2: 24 mmol/L (ref 20–29)
Calcium: 9.1 mg/dL (ref 8.7–10.2)
Chloride: 100 mmol/L (ref 96–106)
Creatinine, Ser: 0.98 mg/dL (ref 0.76–1.27)
Glucose: 94 mg/dL (ref 70–99)
Potassium: 4.6 mmol/L (ref 3.5–5.2)
Sodium: 136 mmol/L (ref 134–144)
eGFR: 98 mL/min/{1.73_m2} (ref >59)

## 2022-05-25 ENCOUNTER — Ambulatory Visit: Payer: Self-pay | Admitting: Family Medicine
